# Patient Record
Sex: Female | Born: 1970 | Race: White | Hispanic: No | Marital: Married | Smoking: Former smoker
Health system: Southern US, Community
[De-identification: ages and names within clinical notes are randomized; demographics above are authoritative.]

## PROBLEM LIST (undated history)

## (undated) DIAGNOSIS — O24419 Gestational diabetes mellitus in pregnancy, unspecified control: Secondary | ICD-10-CM

## (undated) DIAGNOSIS — F410 Panic disorder [episodic paroxysmal anxiety] without agoraphobia: Secondary | ICD-10-CM

## (undated) DIAGNOSIS — O139 Gestational [pregnancy-induced] hypertension without significant proteinuria, unspecified trimester: Secondary | ICD-10-CM

## (undated) HISTORY — PX: REDUCTION MAMMAPLASTY: SUR839

## (undated) HISTORY — PX: BREAST REDUCTION SURGERY: SHX8

## (undated) HISTORY — PX: APPENDECTOMY: SHX54

## (undated) HISTORY — PX: ABDOMINOPLASTY: SUR9

---

## 2000-11-06 ENCOUNTER — Other Ambulatory Visit: Admission: RE | Admit: 2000-11-06 | Discharge: 2000-11-06 | Payer: Self-pay | Admitting: Obstetrics & Gynecology

## 2000-12-27 ENCOUNTER — Other Ambulatory Visit: Admission: RE | Admit: 2000-12-27 | Discharge: 2000-12-27 | Payer: Self-pay | Admitting: Obstetrics and Gynecology

## 2004-04-15 ENCOUNTER — Emergency Department (HOSPITAL_COMMUNITY): Admission: EM | Admit: 2004-04-15 | Discharge: 2004-04-15 | Payer: Self-pay | Admitting: Emergency Medicine

## 2009-09-16 ENCOUNTER — Encounter: Admission: RE | Admit: 2009-09-16 | Discharge: 2009-09-16 | Payer: Self-pay | Admitting: Obstetrics and Gynecology

## 2017-09-16 ENCOUNTER — Ambulatory Visit (INDEPENDENT_AMBULATORY_CARE_PROVIDER_SITE_OTHER): Payer: BLUE CROSS/BLUE SHIELD | Admitting: Psychology

## 2017-09-16 DIAGNOSIS — F4323 Adjustment disorder with mixed anxiety and depressed mood: Secondary | ICD-10-CM

## 2017-09-17 NOTE — Progress Notes (Deleted)
[facility] [address] [place], [place] [address] [phone]  Date:  09/18/2017   Name:  Maria Bell   DOB:  [birth year]   MRN:  [id number]  PCP:  Patient, No Pcp Per    Chief Complaint: No chief complaint on file.   History of Present Illness:  Maria Bell is a 46 y.o. very pleasant female patient who presents with the following:  Here today as a new patient to establish care Reviewed a note from the ER at [facility] from last summer:  Maria Bell is a 46 y.o. female presents to the ED via EMS due to a panic attack, onset PTA. Patient states that she is going through a difficult divorce and she had a bad argument with her husband who she states is a big time music executive. Patient states that she shares a 2 year old son with this husband and she states that stress of everything is causing her to begin having panic attacks. Patient has no previous hx of panic attacks until recently and states that when these panic attacks come on, she feels like she cannot breathe, move, or even remember her name, but once they subside she is ok. She also states that she gets numbness and tingling in her extremities and a feeling of close syncope when the attacks come on. Patient states her first panic attack happened at her OBGYN who treated her for it. Patient lives in [place] and her husband lives in [place] and she states that her husband had to come here from [place] to get her son for this panic attack episode and now her attorneys are worried about her obtaining and keeping custody of her son which is adding to her anxiety. She states she also had a panic attack on the stand in court in [place] two months ago where BP was 180/120. Patient currently has a headache and is requesting pain medication. Patient denies SI, HI, or audiovisual hallucinations.  Past Medical History Past Medical History  Diagnosis Date  . ADD (attention  deficit disorder)  . Panic attack   Past Surgical History Past Surgical History  Procedure Laterality Date  . Appendectomy  . Abdominoplasty  . Reduction mammaplasty  . Cesarean section   Per notes, she requested narcotic pain medications and also vyvanse at that time, was told no, and left AMA.   Reviewed NCCSR 10/16: he has been getting an unusual combination of adderall 20 and 10 from a couple of differnet providers recently     There are no active problems to display for this patient.   No past medical history on file.  No past surgical history on file.  Social History  Substance Use Topics  . Smoking status: Not on file  . Smokeless tobacco: Not on file  . Alcohol use Not on file    No family history on file.  Allergies not on file  Medication list has been reviewed and updated.  No current outpatient prescriptions on file prior to visit.   No current facility-administered medications on file prior to visit.     Review of Systems:  As per HPI- otherwise negative.   Physical Examination: There were no vitals filed for this visit. There were no vitals filed for this visit. There is no height or weight on file to calculate BMI. Ideal Body Weight:    GEN: WDWN, NAD, Non-toxic, A & O x 3 HEENT:  Atraumatic, Normocephalic. Neck supple. No masses, No LAD. Ears and Nose: No external deformity. CV: RRR, No M/G/R. No JVD. No thrill. No extra heart sounds. PULM: CTA B, no wheezes, crackles, rhonchi. No retractions. No resp. distress. No accessory muscle use. ABD: S, NT, ND, +BS. No rebound. No HSM. EXTR: No c/c/e NEURO Normal gait.  PSYCH: Normally interactive. Conversant. Not depressed or anxious appearing.  Calm demeanor.    Assessment and Plan: ***  Signed Lamar Blinks, MD

## 2017-09-18 ENCOUNTER — Ambulatory Visit: Payer: Self-pay | Admitting: Family Medicine

## 2017-09-18 DIAGNOSIS — Z0289 Encounter for other administrative examinations: Secondary | ICD-10-CM

## 2017-09-30 ENCOUNTER — Ambulatory Visit: Payer: Self-pay | Admitting: Psychology

## 2017-10-01 ENCOUNTER — Ambulatory Visit (INDEPENDENT_AMBULATORY_CARE_PROVIDER_SITE_OTHER): Payer: BLUE CROSS/BLUE SHIELD | Admitting: Psychology

## 2017-10-01 DIAGNOSIS — F4323 Adjustment disorder with mixed anxiety and depressed mood: Secondary | ICD-10-CM

## 2017-10-21 ENCOUNTER — Ambulatory Visit: Payer: BLUE CROSS/BLUE SHIELD | Admitting: Psychology

## 2017-10-28 ENCOUNTER — Ambulatory Visit: Payer: Self-pay | Admitting: Psychology

## 2017-10-30 ENCOUNTER — Ambulatory Visit (INDEPENDENT_AMBULATORY_CARE_PROVIDER_SITE_OTHER): Payer: BLUE CROSS/BLUE SHIELD | Admitting: Psychology

## 2017-10-30 DIAGNOSIS — F4323 Adjustment disorder with mixed anxiety and depressed mood: Secondary | ICD-10-CM | POA: Diagnosis not present

## 2017-11-13 ENCOUNTER — Ambulatory Visit (INDEPENDENT_AMBULATORY_CARE_PROVIDER_SITE_OTHER): Payer: BLUE CROSS/BLUE SHIELD | Admitting: Psychology

## 2017-11-13 DIAGNOSIS — F4323 Adjustment disorder with mixed anxiety and depressed mood: Secondary | ICD-10-CM | POA: Diagnosis not present

## 2017-11-22 ENCOUNTER — Ambulatory Visit (INDEPENDENT_AMBULATORY_CARE_PROVIDER_SITE_OTHER): Payer: BLUE CROSS/BLUE SHIELD | Admitting: Psychology

## 2017-11-22 DIAGNOSIS — F4323 Adjustment disorder with mixed anxiety and depressed mood: Secondary | ICD-10-CM | POA: Diagnosis not present

## 2017-12-23 ENCOUNTER — Ambulatory Visit: Payer: Self-pay | Admitting: Psychology

## 2017-12-24 ENCOUNTER — Ambulatory Visit (INDEPENDENT_AMBULATORY_CARE_PROVIDER_SITE_OTHER): Payer: BLUE CROSS/BLUE SHIELD | Admitting: Psychology

## 2017-12-24 DIAGNOSIS — F4323 Adjustment disorder with mixed anxiety and depressed mood: Secondary | ICD-10-CM | POA: Diagnosis not present

## 2018-01-01 ENCOUNTER — Ambulatory Visit (INDEPENDENT_AMBULATORY_CARE_PROVIDER_SITE_OTHER): Payer: BLUE CROSS/BLUE SHIELD | Admitting: Psychology

## 2018-01-01 DIAGNOSIS — F4323 Adjustment disorder with mixed anxiety and depressed mood: Secondary | ICD-10-CM

## 2018-01-02 ENCOUNTER — Ambulatory Visit: Payer: BLUE CROSS/BLUE SHIELD | Admitting: Psychology

## 2018-01-13 ENCOUNTER — Ambulatory Visit: Payer: BLUE CROSS/BLUE SHIELD | Admitting: Psychology

## 2018-01-23 ENCOUNTER — Ambulatory Visit (INDEPENDENT_AMBULATORY_CARE_PROVIDER_SITE_OTHER): Payer: BLUE CROSS/BLUE SHIELD | Admitting: Psychology

## 2018-01-23 DIAGNOSIS — F4323 Adjustment disorder with mixed anxiety and depressed mood: Secondary | ICD-10-CM | POA: Diagnosis not present

## 2018-02-28 ENCOUNTER — Emergency Department (HOSPITAL_BASED_OUTPATIENT_CLINIC_OR_DEPARTMENT_OTHER): Payer: BLUE CROSS/BLUE SHIELD

## 2018-02-28 ENCOUNTER — Emergency Department (HOSPITAL_BASED_OUTPATIENT_CLINIC_OR_DEPARTMENT_OTHER)
Admission: EM | Admit: 2018-02-28 | Discharge: 2018-02-28 | Disposition: A | Discharge status: Home / Self Care | Payer: BLUE CROSS/BLUE SHIELD | Attending: Emergency Medicine | Admitting: Emergency Medicine

## 2018-02-28 ENCOUNTER — Encounter (HOSPITAL_BASED_OUTPATIENT_CLINIC_OR_DEPARTMENT_OTHER): Payer: Self-pay

## 2018-02-28 ENCOUNTER — Other Ambulatory Visit: Payer: Self-pay

## 2018-02-28 DIAGNOSIS — F17200 Nicotine dependence, unspecified, uncomplicated: Secondary | ICD-10-CM | POA: Diagnosis not present

## 2018-02-28 DIAGNOSIS — R42 Dizziness and giddiness: Secondary | ICD-10-CM

## 2018-02-28 DIAGNOSIS — R51 Headache: Secondary | ICD-10-CM | POA: Insufficient documentation

## 2018-02-28 DIAGNOSIS — R519 Headache, unspecified: Secondary | ICD-10-CM

## 2018-02-28 HISTORY — DX: Panic disorder (episodic paroxysmal anxiety): F41.0

## 2018-02-28 HISTORY — DX: Gestational diabetes mellitus in pregnancy, unspecified control: O24.419

## 2018-02-28 HISTORY — DX: Gestational (pregnancy-induced) hypertension without significant proteinuria, unspecified trimester: O13.9

## 2018-02-28 LAB — CBC
HCT: 45 % (ref 36.0–46.0)
Hemoglobin: 16.2 g/dL — ABNORMAL HIGH (ref 12.0–15.0)
MCH: 32.1 pg (ref 26.0–34.0)
MCHC: 36 g/dL (ref 30.0–36.0)
MCV: 89.3 fL (ref 78.0–100.0)
Platelets: 231 10*3/uL (ref 150–400)
RBC: 5.04 MIL/uL (ref 3.87–5.11)
RDW: 12.6 % (ref 11.5–15.5)
WBC: 7.5 10*3/uL (ref 4.0–10.5)

## 2018-02-28 LAB — URINALYSIS, ROUTINE W REFLEX MICROSCOPIC
Bilirubin Urine: NEGATIVE
Glucose, UA: NEGATIVE mg/dL
Hgb urine dipstick: NEGATIVE
Ketones, ur: NEGATIVE mg/dL
Leukocytes, UA: NEGATIVE
Nitrite: NEGATIVE
Protein, ur: NEGATIVE mg/dL
Specific Gravity, Urine: <1.005 — ABNORMAL LOW (ref 1.005–1.030)
pH: 6 (ref 5.0–8.0)

## 2018-02-28 LAB — TROPONIN I: Troponin I: <0.03 ng/mL (ref <0.03)

## 2018-02-28 LAB — BASIC METABOLIC PANEL
Anion gap: 11 (ref 5–15)
BUN: 10 mg/dL (ref 6–20)
CO2: 25 mmol/L (ref 22–32)
Calcium: 9.9 mg/dL (ref 8.9–10.3)
Chloride: 102 mmol/L (ref 101–111)
Creatinine, Ser: 0.96 mg/dL (ref 0.44–1.00)
GFR calc Af Amer: >60 mL/min (ref >60)
GFR calc non Af Amer: >60 mL/min (ref >60)
Glucose, Bld: 107 mg/dL — ABNORMAL HIGH (ref 65–99)
Potassium: 3.5 mmol/L (ref 3.5–5.1)
Sodium: 138 mmol/L (ref 135–145)

## 2018-02-28 LAB — CBG MONITORING, ED: Glucose-Capillary: 77 mg/dL (ref 65–99)

## 2018-02-28 LAB — D-DIMER, QUANTITATIVE: D-Dimer, Quant: <0.27 ug/mL-FEU (ref 0.00–0.50)

## 2018-02-28 LAB — PREGNANCY, URINE: Preg Test, Ur: NEGATIVE

## 2018-02-28 MED ORDER — SODIUM CHLORIDE 0.9 % IV BOLUS
1000.0000 mL | Freq: Once | INTRAVENOUS | Status: AC
  Administered 2018-02-28: 1000 mL via INTRAVENOUS

## 2018-02-28 MED ORDER — ACETAMINOPHEN 325 MG PO TABS
650.0000 mg | ORAL_TABLET | Freq: Once | ORAL | Status: AC
  Administered 2018-02-28: 650 mg via ORAL
  Filled 2018-02-28: qty 2

## 2018-02-28 MED ORDER — METOCLOPRAMIDE HCL 10 MG PO TABS
10.0000 mg | ORAL_TABLET | Freq: Once | ORAL | Status: AC
  Administered 2018-02-28: 10 mg via ORAL
  Filled 2018-02-28: qty 1

## 2018-02-28 MED ORDER — LORAZEPAM 0.5 MG PO TABS: 0.5000 mg | ORAL_TABLET | Freq: Three times a day (TID) | ORAL | 0 refills | Status: DC

## 2018-02-28 MED ORDER — KETOROLAC TROMETHAMINE 15 MG/ML IJ SOLN
15.0000 mg | Freq: Once | INTRAMUSCULAR | Status: AC
  Administered 2018-02-28: 15 mg via INTRAVENOUS
  Filled 2018-02-28: qty 1

## 2018-02-28 NOTE — ED Notes (Signed)
ED Provider at bedside. 

## 2018-02-28 NOTE — ED Triage Notes (Signed)
C/o dizziness, HA, right side facial ain, right UE and LE numbness x 5 days-reports hx of same type sx 1.5 years ago with no testing/dx-pt NAD-steady gait

## 2018-02-28 NOTE — Discharge Instructions (Addendum)
The CT scan of your head did not show any evidence of bleeding in your brain or prior stroke. Your chest x-ray was negative for pneumonia or collapsed lung.  Your lab work was negative for any elevation in your white blood cell count, anemia, or electrolyte abnormalities.  You had normal kidney function.  You will need to follow-up with your primary care doctor for further treatment of your symptoms.  You should return to the emergency department for any persistent headaches, fevers, chest pain, shortness of breath, or any new or worsening symptoms.

## 2018-02-28 NOTE — ED Notes (Signed)
Pt reports feeling fatigued, HA, and "gagging that is triggered by smells". Pt very tearful on assessment.

## 2018-02-28 NOTE — ED Notes (Signed)
Pt returned from radiology.

## 2018-02-28 NOTE — ED Provider Notes (Signed)
[facility] EMERGENCY DEPARTMENT Provider Note   CSN: [id number] Arrival date & time: 02/28/18  1522     History   Chief Complaint Chief Complaint  Patient presents with  . Dizziness    HPI Maria Bell is a 47 y.o. female.  HPI   Patient is a 47 year old female with history of ADHD and chronic headaches presents the ED today complaining of lightheadedness/dizziness that began 5 days ago.  Patient describes a feeling as "feels like I am going to pass out".  States she has had intermittent episodes but is not able to articulate how long episodes last.  States she had an episode lasted about an hour today.  Episodes not specifically associated with exertion and can happen anytime.  Notes associated blurry vision and intermittent weakness to the bilateral upper extremities when she has symptoms.  Denies that she has actually passed out.  Denies that the room is spinning when these episodes occur.  She also reports associated chest heaviness and shortness of breath.  States "feels like I just ran a marathon."  Denies actual pain to her chest. States she has "violent gag episodes" that are triggered by smells, which cause her to lose control of her bladder. Reports intermittent tingling to BLE and feet. No numbness to BUE or BLE. No persistent weakness to extremities.   Also reports intermittent headaches over the last 5 days as well.  States pain is 8/10.  Denies sudden onset.  Denies worse headache of life.  States she has a history of similar headaches that are normally relieved with Tylenol and Advil. States she has been taking a friend's blood pressure medication, losartan 12.5 mg for the last 5 days since she started having symptoms.  Symptoms began before taking the medication.  Pt had similar sxs 1.5 years ago. States she was told she had a panic attack at that time but she does not believe that sxs were related to panic attack.   Denies recent fevers, cough, cold, congestion,  sore throat.  Denies abdominal pain, vomiting, diarrhea, constipation.  Denies urinary or vaginal symptoms.  Denies leg pain/swelling, hemoptysis, recent surgery/trauma, recent long travel, hormone use, personal hx of cancer, or hx of DVT/PE.  Patient says she went through menopause at 57 and there is no chance of pregnancy.  Past Medical History:  Diagnosis Date  . Panic attack   . Pregnancy induced hypertension   . Pregnancy-induced diabetes     There are no active problems to display for this patient.   Past Surgical History:  Procedure Laterality Date  . ABDOMINOPLASTY    . APPENDECTOMY    . BREAST REDUCTION SURGERY    . CESAREAN SECTION       OB History   None      Home Medications    Prior to Admission medications   Medication Sig Start Date End Date Taking? Authorizing Provider  LORazepam (ATIVAN) 0.5 MG tablet Take 1 tablet (0.5 mg total) by mouth every 8 (eight) hours. Do not work, drive or operate machinery while taking this medication 02/28/18   Chrystina Naff S, PA-C    Family History No family history on file.  Social History Social History   Tobacco Use  . Smoking status: Current Every Day Smoker  . Smokeless tobacco: Never Used  Substance Use Topics  . Alcohol use: Yes    Comment: daily  . Drug use: Never     Allergies   Patient has no known allergies.  Review of Systems Review of Systems  Constitutional: Negative for chills and fever.  HENT: Negative for congestion, ear pain, rhinorrhea and sore throat.   Eyes: Positive for visual disturbance (resolved). Negative for photophobia.  Respiratory: Positive for shortness of breath. Negative for cough and wheezing.   Cardiovascular: Negative for chest pain and leg swelling.       Chest heaviness  Gastrointestinal: Positive for nausea. Negative for abdominal pain, blood in stool, constipation, diarrhea and vomiting.  Genitourinary: Negative for dysuria, flank pain, frequency, hematuria and  urgency.  Musculoskeletal: Negative for back pain and neck pain.  Skin: Negative for color change and rash.  Neurological: Positive for dizziness, weakness, light-headedness and headaches. Negative for seizures, syncope and numbness.  All other systems reviewed and are negative.    Physical Exam Updated Vital Signs BP 112/84   Pulse 84   Temp 98.5 F (36.9 C) (Oral)   Resp (!) 23   Ht 5\' 4"  (1.626 m)   Wt 74.7 kg (164 lb 10.9 oz)   SpO2 100%   BMI 28.27 kg/m   Physical Exam  Constitutional: She appears well-developed and well-nourished. No distress.  HENT:  Head: Normocephalic and atraumatic.  Right Ear: External ear normal.  Left Ear: External ear normal.  Nose: Nose normal.  Mouth/Throat: Oropharynx is clear and moist. No oropharyngeal exudate.  Eyes: Pupils are equal, round, and reactive to light. Conjunctivae and EOM are normal.  No nystagmus  Neck: Normal range of motion. Neck supple.  Cardiovascular: Normal rate, regular rhythm, normal heart sounds and intact distal pulses.  No murmur heard. Pulmonary/Chest: Effort normal and breath sounds normal. No respiratory distress. She has no wheezes.  Abdominal: Soft. Bowel sounds are normal. She exhibits no distension. There is no tenderness. There is no guarding.  Musculoskeletal: She exhibits no edema.  No calf TTP, erythema, swelling.  Neurological: She is alert.  Mental Status:  Alert, thought content appropriate, able to give a coherent history. Speech fluent without evidence of aphasia. Able to follow 2 step commands without difficulty.  Cranial Nerves:  II:  Peripheral visual fields grossly normal, pupils equal, round, reactive to light III,IV, VI: ptosis not present, extra-ocular motions intact bilaterally  V,VII: smile symmetric, facial light touch sensation equal VIII: hearing grossly normal to voice  X: uvula elevates symmetrically  XI: bilateral shoulder shrug symmetric and strong XII: midline tongue  extension without fassiculations Motor:  Normal tone. 5/5 strength of BUE and BLE major muscle groups including strong and equal grip strength and dorsiflexion/plantar flexion Sensory: light touch normal in all extremities. DTRs: biceps and achilles 2+ symmetric b/l Cerebellar: normal finger-to-nose with bilateral upper extremities Gait: normal gait and balance. Able to walk on toes and heels with ease.  CV: 2+ radial and DP/PT pulses Negative romberg, no pronator drift  Skin: Skin is warm and dry.  Psychiatric: She has a normal mood and affect.  Nursing note and vitals reviewed.    ED Treatments / Results  Labs (all labs ordered are listed, but only abnormal results are displayed) Labs Reviewed  BASIC METABOLIC PANEL - Abnormal; Notable for the following components:      Result Value   Glucose, Bld 107 (*)    All other components within normal limits  CBC - Abnormal; Notable for the following components:   Hemoglobin 16.2 (*)    All other components within normal limits  URINALYSIS, ROUTINE W REFLEX MICROSCOPIC - Abnormal; Notable for the following components:   Specific Gravity, Urine <  1.005 (*)    All other components within normal limits  PREGNANCY, URINE  D-DIMER, QUANTITATIVE (NOT AT [facility])  TROPONIN I  CBG MONITORING, ED    EKG EKG Interpretation  Date/Time:  Friday February 28 2018 15:40:16 EDT Ventricular Rate:  120 PR Interval:  122 QRS Duration: 80 QT Interval:  326 QTC Calculation: 460 R Axis:   68 Text Interpretation:  Sinus tachycardia Right atrial enlargement Borderline ECG no prior to compare with  Confirmed by Meridee ScoreButler, Michael [phone]([id number]) on 02/28/2018 4:29:26 PM   Radiology Dg Chest 2 View  Result Date: 02/28/2018 CLINICAL DATA:  Gagging for 1 week.  Headaches. EXAM: CHEST - 2 VIEW COMPARISON:  None. FINDINGS: The heart size and mediastinal contours are within normal limits. Both lungs are clear. The visualized skeletal structures are unremarkable.  IMPRESSION: No active cardiopulmonary disease. Electronically Signed   By: Elige KoHetal  Patel   On: 02/28/2018 19:56   Ct Head Wo Contrast  Result Date: 02/28/2018 CLINICAL DATA:  Headache and right arm weakness EXAM: CT HEAD WITHOUT CONTRAST TECHNIQUE: Contiguous axial images were obtained from the base of the skull through the vertex without intravenous contrast. COMPARISON:  Head CT 02/08/2015 FINDINGS: Brain: No mass lesion, intraparenchymal hemorrhage or extra-axial collection. No evidence of acute cortical infarct. Normal appearance of the brain parenchyma and extra axial spaces for age. Vascular: No hyperdense vessel or unexpected vascular calcification. Skull: Normal visualized skull base, calvarium and extracranial soft tissues. Sinuses/Orbits: No sinus fluid levels or advanced mucosal thickening. No mastoid effusion. Normal orbits. IMPRESSION: Normal head CT. Electronically Signed   By: Deatra RobinsonKevin  Herman M.D.   On: 02/28/2018 19:44    Procedures Procedures (including critical care time)  Medications Ordered in ED Medications  sodium chloride 0.9 % bolus 1,000 mL (0 mLs Intravenous Stopped 02/28/18 2044)  acetaminophen (TYLENOL) tablet 650 mg (650 mg Oral Given 02/28/18 1912)  ketorolac (TORADOL) 15 MG/ML injection 15 mg (15 mg Intravenous Given 02/28/18 2013)  metoCLOPramide (REGLAN) tablet 10 mg (10 mg Oral Given 02/28/18 2013)     Initial Impression / Assessment and Plan / ED Course  I have reviewed the triage vital signs and the nursing notes.  Pertinent labs & imaging results that were available during my care of the patient were reviewed by me and considered in my medical decision making (see chart for details).  Clinical Course as of Mar 01 2119  Fri Feb 28, 2018  2033 Well-appearing 47 year old female complaining of worsening headache with associated dizziness and elevated blood pressure over the course of a few days.  Her workup including head CT and d-dimer been negative.  She has an  appointment with her PCP and I think it is reasonable that we give her some symptomatic relief until then.   [MB]    Clinical Course User Index [MB] Terrilee FilesButler, Michael C, MD   Pt requesting ativan during Dr. Silvana NewnessButler's exam. Will give short course of ativan to go home with. He personally evaluated the pt and agrees with plan for discharge.  Final Clinical Impressions(s) / ED Diagnoses   Final diagnoses:  Nonintractable headache, unspecified chronicity pattern, unspecified headache type  Lightheaded   Patient is to be discharged with recommendation to follow up with PCP in regards to today's hospital visit. Lightheadedness/near syncope/headahce sxs of unclear etiology. No focal deficits on neuro exam. Head ct negative. Pt convinced that headaches are due to elevated blood pressure despite BP reading of 112/84. Do not suspect acute/emergency intracranial abnormality that could be  causing sxs. She has a h/o headaches and was tx with toradal and reglan here. sxs are unlikely due to cardiac etiology as cardiac workup negative. sxs not likely of pulmonary etiology d/t presentation, PERC negative, VSS, no tracheal deviation, no JVD or new murmur, RRR, breath sounds equal bilaterally, EKG without acute abnormalities, negative troponin, and negative CXR. ddimer negative. Ecg with sinus tach to 120, however no arrhythmia noted. No st elevations or depressions. Pt had HR between 80-90 throughout my entire exam and during all rechecks. She has been advised to return to the ED if CP becomes exertional, associated with diaphoresis or nausea, radiates to left jaw/arm, worsens or becomes concerning in any way. Advised her to stop taking her friends bp medication and to have her BP rechecked in 48 hours. Advised that if elevated she can return to ED, urgent care, or pcp for further eval. Pt appears reliable for follow up and is agreeable to discharge.    ED Discharge Orders        Ordered    LORazepam (ATIVAN) 0.5 MG  tablet  Every 8 hours     02/28/18 2042       Rayne Du 02/28/18 2120    Terrilee Files, MD 03/01/18 [phone]

## 2018-03-07 ENCOUNTER — Ambulatory Visit (INDEPENDENT_AMBULATORY_CARE_PROVIDER_SITE_OTHER): Payer: BLUE CROSS/BLUE SHIELD | Admitting: Family Medicine

## 2018-03-07 ENCOUNTER — Encounter: Payer: Self-pay | Admitting: Family Medicine

## 2018-03-07 VITALS — BP 120/88 | HR 80 | Temp 98.6°F | Ht 64.0 in | Wt 166.2 lb

## 2018-03-07 DIAGNOSIS — Z0000 Encounter for general adult medical examination without abnormal findings: Secondary | ICD-10-CM | POA: Diagnosis not present

## 2018-03-07 DIAGNOSIS — Z720 Tobacco use: Secondary | ICD-10-CM | POA: Diagnosis not present

## 2018-03-07 DIAGNOSIS — Z23 Encounter for immunization: Secondary | ICD-10-CM | POA: Diagnosis not present

## 2018-03-07 DIAGNOSIS — Z1283 Encounter for screening for malignant neoplasm of skin: Secondary | ICD-10-CM | POA: Diagnosis not present

## 2018-03-07 LAB — LIPID PANEL
Cholesterol: 224 mg/dL — ABNORMAL HIGH (ref 0–200)
HDL: 60.7 mg/dL (ref >39.00)
LDL Cholesterol: 144 mg/dL — ABNORMAL HIGH (ref 0–99)
NonHDL: 163.7
Total CHOL/HDL Ratio: 4
Triglycerides: 98 mg/dL (ref 0.0–149.0)
VLDL: 19.6 mg/dL (ref 0.0–40.0)

## 2018-03-07 LAB — COMPREHENSIVE METABOLIC PANEL
ALT: 31 U/L (ref 0–35)
AST: 20 U/L (ref 0–37)
Albumin: 4.4 g/dL (ref 3.5–5.2)
Alkaline Phosphatase: 79 U/L (ref 39–117)
BUN: 10 mg/dL (ref 6–23)
CO2: 30 mEq/L (ref 19–32)
Calcium: 9.8 mg/dL (ref 8.4–10.5)
Chloride: 103 mEq/L (ref 96–112)
Creatinine, Ser: 0.79 mg/dL (ref 0.40–1.20)
GFR: 83.8 mL/min (ref >60.00)
Glucose, Bld: 101 mg/dL — ABNORMAL HIGH (ref 70–99)
Potassium: 4.9 mEq/L (ref 3.5–5.1)
Sodium: 141 mEq/L (ref 135–145)
Total Bilirubin: 0.6 mg/dL (ref 0.2–1.2)
Total Protein: 6.9 g/dL (ref 6.0–8.3)

## 2018-03-07 LAB — CBC
HCT: 41.2 % (ref 36.0–46.0)
Hemoglobin: 14.5 g/dL (ref 12.0–15.0)
MCHC: 35.1 g/dL (ref 30.0–36.0)
MCV: 91.5 fl (ref 78.0–100.0)
Platelets: 196 10*3/uL (ref 150.0–400.0)
RBC: 4.51 Mil/uL (ref 3.87–5.11)
RDW: 13.1 % (ref 11.5–15.5)
WBC: 5.9 10*3/uL (ref 4.0–10.5)

## 2018-03-07 NOTE — Progress Notes (Signed)
Pre visit review using our clinic review tool, if applicable. No additional management support is needed unless otherwise documented below in the visit note. 

## 2018-03-07 NOTE — Progress Notes (Signed)
Chief Complaint  Patient presents with  . Establish Care     Well [facility] is here for a complete physical.   Her last physical was >1 year ago.  Current diet: in general, a "healthy" diet. Current exercise: none.  Weight is stable and she denies daytime fatigue. No LMP recorded. Patient is postmenopausal. Seatbelt? Yes  Health Maintenance Pap/HPV- Yes Mammogram- Yes Tetanus- No HIV screening- Yes - 03/20/17  Past Medical History:  Diagnosis Date  . Panic attack   . Pregnancy induced hypertension   . Pregnancy-induced diabetes      Past Surgical History:  Procedure Laterality Date  . ABDOMINOPLASTY    . APPENDECTOMY    . BREAST REDUCTION SURGERY    . CESAREAN SECTION     Medications  Current Outpatient Medications on File Prior to Visit  Medication Sig Dispense Refill  . amphetamine-dextroamphetamine (ADDERALL) 20 MG tablet Take 20 mg by mouth daily.     Allergies No Known Allergies  Review of Systems: Constitutional:  no fevers  Eye:  +poor vision Ear/Nose/Mouth/Throat:  Ears:  no hearing loss Nose/Mouth/Throat:  no complaints of nasal congestion, no sore throat Cardiovascular: no chest pain, no palpitations Respiratory:  no cough and no shortness of breath Gastrointestinal:  no abdominal pain, no change in bowel habits, no significant change in appetite, no nausea, vomiting, diarrhea, or constipation and no black or bloody stool GU:  Female: negative for dysuria, frequency, and incontinence; no abnormal bleeding, pelvic pain, or discharge Musculoskeletal/Extremities:  no pain of the joints Integumentary (Skin/Breast):  No specific abnormal skin lesions reported, has sun spots she would like to see a dermatologist for Neurologic:  no headaches Hematologic/Lymphatic:  no unexpected weight changes  Exam BP 120/88 (BP Location: Right Arm, Patient Position: Sitting, Cuff Size: Normal)   Pulse 80   Temp 98.6 F (37 C) (Oral)   Ht 5\' 4"  (1.626 m)   Wt  166 lb 4 oz (75.4 kg)   SpO2 98%   BMI 28.54 kg/m  General:  well developed, well nourished, in no apparent distress Skin: various solar lentigo noted on exposed skin; otherwise no significant moles, warts, or growths Head:  no masses, lesions, or tenderness Eyes:  pupils equal and round, sclera anicteric without injection Ears:  canals without lesions, TMs shiny without retraction, no obvious effusion, no erythema Nose:  nares patent, septum midline, mucosa normal, and no drainage or sinus tenderness Throat/Pharynx:  lips and gingiva without lesion; tongue and uvula midline; non-inflamed pharynx; no exudates or postnasal drainage Neck: neck supple without adenopathy, thyromegaly, or masses Breasts:  Not done Thorax:  nontender Lungs:  clear to auscultation, breath sounds equal bilaterally, no respiratory distress Cardio:  regular rate and rhythm without murmurs, heart sounds without clicks or rubs, point of maximal impulse normal; no lifts, heaves, or thrills Abdomen:  abdomen soft, nontender; bowel sounds normal; no masses or organomegaly Genital: Defer to GYN Musculoskeletal:  symmetrical muscle groups noted without atrophy or deformity Extremities:  no clubbing, cyanosis, or edema, no deformities, no skin discoloration Neuro:  gait normal; deep tendon reflexes normal and symmetric Psych: well oriented with normal range of affect and appropriate judgment/insight  Assessment and Plan  Well adult exam - Plan: CBC, Comprehensive metabolic panel, Lipid panel  Need for tetanus booster - Plan: Tdap vaccine greater than or equal to 7yo IM  Need for vaccination against Streptococcus pneumoniae - Plan: Pneumococcal polysaccharide vaccine 23-valent greater than or equal to 2yo subcutaneous/IM  Skin exam for malignant neoplasm - Plan: Ambulatory referral to Dermatology  Tobacco abuse   Well 47 y.o. female. Counseled on diet and exercise. She plans to get back down to 120 lbs by cleaning  up diet and starting to exercise again.  Other orders as above. Stop smoking.  She follows with GYN for women's health.  Follow up in 1 yr or prn. The patient voiced understanding and agreement to the plan.  Jilda Roche [place], DO 03/07/18 12:06 PM

## 2018-03-07 NOTE — Patient Instructions (Signed)
Aim to do some physical exertion for 150 minutes per week. This is typically divided into 5 days per week, 30 minutes per day. The activity should be enough to get your heart rate up. Anything is better than nothing if you have time constraints.  Keep the diet clean.  Stop smoking!  Let us know if you need anything.

## 2018-03-19 ENCOUNTER — Encounter: Payer: BLUE CROSS/BLUE SHIELD | Admitting: Family Medicine

## 2018-03-20 ENCOUNTER — Other Ambulatory Visit: Payer: Self-pay | Admitting: Obstetrics and Gynecology

## 2018-03-20 DIAGNOSIS — R928 Other abnormal and inconclusive findings on diagnostic imaging of breast: Secondary | ICD-10-CM

## 2018-03-25 ENCOUNTER — Ambulatory Visit
Admission: RE | Admit: 2018-03-25 | Discharge: 2018-03-25 | Disposition: A | Discharge status: Home / Self Care | Payer: BLUE CROSS/BLUE SHIELD | Source: Ambulatory Visit | Attending: Obstetrics and Gynecology | Admitting: Obstetrics and Gynecology

## 2018-03-25 ENCOUNTER — Other Ambulatory Visit: Payer: Self-pay | Admitting: Obstetrics and Gynecology

## 2018-03-25 DIAGNOSIS — R928 Other abnormal and inconclusive findings on diagnostic imaging of breast: Secondary | ICD-10-CM

## 2018-03-25 DIAGNOSIS — N6310 Unspecified lump in the right breast, unspecified quadrant: Secondary | ICD-10-CM

## 2018-04-29 ENCOUNTER — Emergency Department (HOSPITAL_BASED_OUTPATIENT_CLINIC_OR_DEPARTMENT_OTHER): Payer: BLUE CROSS/BLUE SHIELD

## 2018-04-29 ENCOUNTER — Emergency Department (HOSPITAL_BASED_OUTPATIENT_CLINIC_OR_DEPARTMENT_OTHER)
Admission: EM | Admit: 2018-04-29 | Discharge: 2018-04-29 | Disposition: A | Discharge status: Home / Self Care | Payer: BLUE CROSS/BLUE SHIELD | Attending: Emergency Medicine | Admitting: Emergency Medicine

## 2018-04-29 ENCOUNTER — Encounter (HOSPITAL_BASED_OUTPATIENT_CLINIC_OR_DEPARTMENT_OTHER): Payer: Self-pay

## 2018-04-29 ENCOUNTER — Other Ambulatory Visit: Payer: Self-pay

## 2018-04-29 DIAGNOSIS — S161XXA Strain of muscle, fascia and tendon at neck level, initial encounter: Secondary | ICD-10-CM | POA: Insufficient documentation

## 2018-04-29 DIAGNOSIS — Y999 Unspecified external cause status: Secondary | ICD-10-CM | POA: Diagnosis not present

## 2018-04-29 DIAGNOSIS — Y929 Unspecified place or not applicable: Secondary | ICD-10-CM | POA: Diagnosis not present

## 2018-04-29 DIAGNOSIS — Y9389 Activity, other specified: Secondary | ICD-10-CM | POA: Insufficient documentation

## 2018-04-29 DIAGNOSIS — F17210 Nicotine dependence, cigarettes, uncomplicated: Secondary | ICD-10-CM | POA: Insufficient documentation

## 2018-04-29 DIAGNOSIS — S0990XA Unspecified injury of head, initial encounter: Secondary | ICD-10-CM | POA: Diagnosis present

## 2018-04-29 DIAGNOSIS — W010XXA Fall on same level from slipping, tripping and stumbling without subsequent striking against object, initial encounter: Secondary | ICD-10-CM | POA: Insufficient documentation

## 2018-04-29 DIAGNOSIS — S060X0A Concussion without loss of consciousness, initial encounter: Secondary | ICD-10-CM | POA: Insufficient documentation

## 2018-04-29 MED ORDER — HYDROCODONE-ACETAMINOPHEN 5-325 MG PO TABS: 1.0000 | ORAL_TABLET | ORAL | 0 refills | Status: DC | PRN

## 2018-04-29 NOTE — ED Provider Notes (Signed)
[facility] EMERGENCY DEPARTMENT Provider Note   CSN: [id number] Arrival date & time: 04/29/18  1109     History   Chief Complaint Chief Complaint  Patient presents with  . Fall    HPI Maria Bell is a 47 y.o. female.  Patient is a 47 year old female who presents with headache after a fall.  She states that 3 days ago in the evening she decided to a hand stand and was leaning against a door that was not all the way closed.  She ended up falling through the door and landing on her head and neck.  For the last 2 days she has had nausea and vomiting with worsening headaches.  Today she felt a little bit better though still having headaches and dizziness.  She complains of a headache which is fairly diffuse.  She is still nauseated and has some dizziness but no vomiting today.  She has pain in her neck but no pain in her lower back.  No radiation down her arms.  No numbness or weakness to her extremities.  She denies any other injuries from the fall.  She is not on anticoagulants.     Past Medical History:  Diagnosis Date  . Panic attack   . Pregnancy induced hypertension   . Pregnancy-induced diabetes     Patient Active Problem List   Diagnosis Date Noted  . Tobacco abuse 03/07/2018    Past Surgical History:  Procedure Laterality Date  . ABDOMINOPLASTY    . APPENDECTOMY    . BREAST REDUCTION SURGERY    . CESAREAN SECTION       OB History   None      Home Medications    Prior to Admission medications   Medication Sig Start Date End Date Taking? Authorizing Provider  amphetamine-dextroamphetamine (ADDERALL) 20 MG tablet Take 20 mg by mouth daily.    [provider]    Family History Family History  Problem Relation Age of Onset  . Heart disease Father   . Breast cancer Maternal Grandmother     Social History Social History   Tobacco Use  . Smoking status: Current Every Day Smoker    Types: Cigarettes  . Smokeless tobacco: Never Used   Substance Use Topics  . Alcohol use: Yes    Comment: daily  . Drug use: Never     Allergies   Patient has no known allergies.   Review of Systems Review of Systems  Constitutional: Negative for activity change, appetite change and fever.  HENT: Negative for dental problem, nosebleeds and trouble swallowing.   Eyes: Negative for pain and visual disturbance.  Respiratory: Negative for shortness of breath.   Cardiovascular: Negative for chest pain.  Gastrointestinal: Positive for nausea and vomiting. Negative for abdominal pain.  Genitourinary: Negative for dysuria and hematuria.  Musculoskeletal: Positive for neck pain. Negative for arthralgias, back pain and joint swelling.  Skin: Negative for wound.  Neurological: Positive for dizziness, light-headedness and headaches. Negative for weakness and numbness.  Psychiatric/Behavioral: Negative for confusion.     Physical Exam Updated Vital Signs BP (!) 151/109 (BP Location: Left Arm)   Pulse 71   Temp 98.6 F (37 C) (Oral)   Resp 16   SpO2 100%   Physical Exam  Constitutional: She is oriented to person, place, and time. She appears well-developed and well-nourished.  HENT:  Head: Normocephalic and atraumatic.  Nose: Nose normal.  No hemotympanum  Eyes: Pupils are equal, round, and reactive to  light. Conjunctivae are normal.  Neck:  Positive tenderness throughout the cervical spine.  No pain to the thoracic or lumbosacral spine.  No step-offs or deformities noted  Cardiovascular: Normal rate and regular rhythm.  No murmur heard. No evidence of external trauma to the chest or abdomen  Pulmonary/Chest: Effort normal and breath sounds normal. No respiratory distress. She has no wheezes. She exhibits no tenderness.  Abdominal: Soft. Bowel sounds are normal. She exhibits no distension. There is no tenderness.  Musculoskeletal: Normal range of motion.  No pain on palpation or ROM of the extremities  Neurological: She is alert  and oriented to person, place, and time.  Motor 5 out of 5 all extremities, sensation grossly intact light touch all extremities  Skin: Skin is warm and dry. Capillary refill takes less than 2 seconds.  Psychiatric: She has a normal mood and affect.  Vitals reviewed.    ED Treatments / Results  Labs (all labs ordered are listed, but only abnormal results are displayed) Labs Reviewed - No data to display  EKG None  Radiology Ct Head Wo Contrast  Result Date: 04/29/2018 CLINICAL DATA:  Fall while doing a handstand several days ago with neck pain and headaches EXAM: CT HEAD WITHOUT CONTRAST CT CERVICAL SPINE WITHOUT CONTRAST TECHNIQUE: Multidetector CT imaging of the head and cervical spine was performed following the standard protocol without intravenous contrast. Multiplanar CT image reconstructions of the cervical spine were also generated. COMPARISON:  02/28/2018 CT of the head FINDINGS: CT HEAD FINDINGS Brain: No evidence of acute infarction, hemorrhage, hydrocephalus, extra-axial collection or mass lesion/mass effect. Vascular: No hyperdense vessel or unexpected calcification. Skull: Normal. Negative for fracture or focal lesion. Sinuses/Orbits: No acute finding. Other: None. CT CERVICAL SPINE FINDINGS Alignment: Within normal limits. Skull base and vertebrae: 7 cervical segments are well visualized. Mild osteophytic changes are noted at C6-7. No acute fracture or acute facet abnormality is noted. The odontoid is within normal limits. Soft tissues and spinal canal: No prevertebral fluid or swelling. No visible canal hematoma. Upper chest: Within normal limits. Other: None IMPRESSION: CT of the head: Normal head CT. CT of the cervical spine: Mild degenerative change at C6-7 without acute abnormality. Electronically Signed   By: Alcide Clever M.D.   On: 04/29/2018 12:36   Ct Cervical Spine Wo Contrast  Result Date: 04/29/2018 CLINICAL DATA:  Fall while doing a handstand several days ago with  neck pain and headaches EXAM: CT HEAD WITHOUT CONTRAST CT CERVICAL SPINE WITHOUT CONTRAST TECHNIQUE: Multidetector CT imaging of the head and cervical spine was performed following the standard protocol without intravenous contrast. Multiplanar CT image reconstructions of the cervical spine were also generated. COMPARISON:  02/28/2018 CT of the head FINDINGS: CT HEAD FINDINGS Brain: No evidence of acute infarction, hemorrhage, hydrocephalus, extra-axial collection or mass lesion/mass effect. Vascular: No hyperdense vessel or unexpected calcification. Skull: Normal. Negative for fracture or focal lesion. Sinuses/Orbits: No acute finding. Other: None. CT CERVICAL SPINE FINDINGS Alignment: Within normal limits. Skull base and vertebrae: 7 cervical segments are well visualized. Mild osteophytic changes are noted at C6-7. No acute fracture or acute facet abnormality is noted. The odontoid is within normal limits. Soft tissues and spinal canal: No prevertebral fluid or swelling. No visible canal hematoma. Upper chest: Within normal limits. Other: None IMPRESSION: CT of the head: Normal head CT. CT of the cervical spine: Mild degenerative change at C6-7 without acute abnormality. Electronically Signed   By: Alcide Clever M.D.   On: 04/29/2018  12:36    Procedures Procedures (including critical care time)  Medications Ordered in ED Medications - No data to display   Initial Impression / Assessment and Plan / ED Course  I have reviewed the triage vital signs and the nursing notes.  Pertinent labs & imaging results that were available during my care of the patient were reviewed by me and considered in my medical decision making (see chart for details).     Patient is a 47 year old female who presents after head injury.  She had no initial loss of consciousness but had ongoing headache and nausea vomiting.  CT scan shows no evidence of intracranial hemorrhage.  There is no evidence of bony injury to the cervical  spine.  She is neurologically intact.  She has no ongoing vomiting.  She was discharged home in good condition.  She was given concussion instructions.  She was encouraged to follow-up with her PCP.  Return precautions were given.  Final Clinical Impressions(s) / ED Diagnoses   Final diagnoses:  Concussion without loss of consciousness, initial encounter  Strain of neck muscle, initial encounter    ED Discharge Orders    None       Rolan Bucco, MD 04/29/18 1243

## 2018-04-29 NOTE — ED Notes (Signed)
ED Provider at bedside discussing test results and dispo plan of care.  Giving detailed instructions for post concussion care.

## 2018-04-29 NOTE — ED Triage Notes (Signed)
Pt states she fell doing a handstand 5/25-c/o HA, vomiting the day after-c/o cont'd sx-NAD-steady gait

## 2018-04-29 NOTE — ED Notes (Signed)
Patient transported to CT 

## 2018-08-06 ENCOUNTER — Ambulatory Visit: Payer: Self-pay | Admitting: *Deleted

## 2018-08-06 NOTE — Telephone Encounter (Signed)
Pt reports increasing anxiety and depression x 2 weeks, since recent  "emergency child custody orders" and ongoing divorce proceedings. States is also "fearful," has received death threats. Questioned if Patent examiner involved, states attorneys are "handling things". Denies any suicidal or homicidal ideations. States was seeing a Veterinary surgeon but not recently. Has been missing work, not sleeping. States BP trending up. Last one checked 149/96, occasional headache. Not eating well.  Pt is not on any antidepressant or anti-anxiety meds. Calm demeanor during NT. Spoke with office staff; OK to schedule with another provider as Dr. Carmelia Roller is unavailable this week. Appt made for tomorrow with Dr. Drue Novel. Behavioral Health number given to patient as a resource.  Reason for Disposition . Symptoms interfere with work or school  Answer Assessment - Initial Assessment Questions 1. CONCERN: "What happened that made you call today?"     Anxious, depressed,fearful, insomnia. 2. ANXIETY SYMPTOM SCREENING: "Can you describe how you have been feeling?"  (e.g., tense, restless, panicky, anxious, keyed up, trouble sleeping, trouble concentrating)    Trouble sleeping, concentrating, fearful, anxious 3. ONSET: "How long have you been feeling this way?"     07/25/18 4. RECURRENT: "Have you felt this way before?"  If yes: "What happened that time?" "What helped these feelings go away in the past?"      H/O anxiety 5. RISK OF HARM - SUICIDAL IDEATION:  "Do you ever have thoughts of hurting or killing yourself?"  (e.g., yes, no, no but preoccupation with thoughts about death)   - INTENT:  "Do you have thoughts of hurting or killing yourself right NOW?" (e.g., yes, no, N/A)   - PLAN: "Do you have a specific plan for how you would do this?" (e.g., gun, knife, overdose, no plan, N/A)     no 6. RISK OF HARM - HOMICIDAL IDEATION:  "Do you ever have thoughts of hurting or killing someone else?"  (e.g., yes, no, no but preoccupation  with thoughts about death)   - INTENT:  "Do you have thoughts of hurting or killing someone right NOW?" (e.g., yes, no, N/A)   - PLAN: "Do you have a specific plan for how you would do this?" (e.g., gun, knife, no plan, N/A)      no 7. FUNCTIONAL IMPAIRMENT: "How have things been going for you overall in your life? Have you had any more difficulties than usual doing your normal daily activities?"  (e.g., better, same, worse; self-care, school, work, interactions)     Working very little, "Survival mode" 8. SUPPORT: "Who is with you now?" "Who do you live with?" "Do you have family or friends nearby who you can talk to?"      Supportive friends and family 21. THERAPIST: "Do you have a counselor or therapist? Name?"     Was seeing one. Not recently 10. STRESSORS: "Has there been any new stress or recent changes in your life?"       Yes, custody battle, divorce. 11. CAFFEINE ABUSE: "Do you drink caffeinated beverages, and how much each day?" (e.g., coffee, tea, colas)       no 12. SUBSTANCE ABUSE: "Do you use any illegal drugs or alcohol?"       no 13. OTHER SYMPTOMS: "Do you have any other physical symptoms right now?" (e.g., chest pain, palpitations, difficulty breathing, fever)      BP been trending higher, headaches  Protocols used: ANXIETY AND PANIC ATTACK-A-AH

## 2018-08-07 ENCOUNTER — Encounter: Payer: Self-pay | Admitting: Internal Medicine

## 2018-08-07 ENCOUNTER — Ambulatory Visit: Payer: BLUE CROSS/BLUE SHIELD | Admitting: Internal Medicine

## 2018-08-07 VITALS — BP 122/68 | HR 90 | Temp 98.1°F | Resp 14 | Ht 64.0 in | Wt 165.5 lb

## 2018-08-07 DIAGNOSIS — F419 Anxiety disorder, unspecified: Secondary | ICD-10-CM

## 2018-08-07 DIAGNOSIS — I10 Essential (primary) hypertension: Secondary | ICD-10-CM

## 2018-08-07 DIAGNOSIS — G44209 Tension-type headache, unspecified, not intractable: Secondary | ICD-10-CM | POA: Diagnosis not present

## 2018-08-07 MED ORDER — ALPRAZOLAM 0.25 MG PO TABS: 0.2500 mg | ORAL_TABLET | Freq: Every evening | ORAL | 0 refills | Status: DC | PRN

## 2018-08-07 MED ORDER — ESCITALOPRAM OXALATE 10 MG PO TABS: 10.0000 mg | ORAL_TABLET | Freq: Every day | ORAL | 1 refills | Status: DC

## 2018-08-07 NOTE — Patient Instructions (Signed)
   GO TO THE FRONT DESK Schedule your next appointment for a  Follow up in 3-4 weeks

## 2018-08-07 NOTE — Progress Notes (Signed)
Pre visit review using our clinic review tool, if applicable. No additional management support is needed unless otherwise documented below in the visit note. 

## 2018-08-07 NOTE — Progress Notes (Signed)
Subjective:    Patient ID: Maria Bell, female    DOB: May 30, 1971, 47 y.o.   MRN: [id number]  DOS:  08/07/2018 Type of visit - description : acute Interval history: The patient has been anxious for several years due to a divorce. Back in 09-08-2024she started to receive death threats from her former husband  Since then her anxiety has increased significantly, she feels scared, cannot sleep, headaches have increased as well. "Almost like if I have PTSD". She started to fear for her and her 21-year-old son.  She already notified police, she has somebody else living at home with her and she feels safer.  Headaches:  go from the upper thorax to the top of the head, on and off, much worse in times of high stress. Occasionally associated with nausea, no phono or photophobia, not the worst headache of her life.  Her gynecologist prescribed Valium which he takes a very small dose at night.   Review of Systems  Denies suicidal ideas Past Medical History:  Diagnosis Date  . Panic attack   . Pregnancy induced hypertension   . Pregnancy-induced diabetes     Past Surgical History:  Procedure Laterality Date  . ABDOMINOPLASTY    . APPENDECTOMY    . BREAST REDUCTION SURGERY    . CESAREAN SECTION      Social History   Socioeconomic History  . Marital status: Married    Spouse name: Not on file  . Number of children: Not on file  . Years of education: Not on file  . Highest education level: Not on file  Occupational History  . Not on file  Social Needs  . Financial resource strain: Not on file  . Food insecurity:    Worry: Not on file    Inability: Not on file  . Transportation needs:    Medical: Not on file    Non-medical: Not on file  Tobacco Use  . Smoking status: Former Smoker    Types: Cigarettes  . Smokeless tobacco: Never Used  Substance and Sexual Activity  . Alcohol use: Yes    Comment: daily  . Drug use: Never  . Sexual activity: Not on file  Lifestyle  .  Physical activity:    Days per week: Not on file    Minutes per session: Not on file  . Stress: Not on file  Relationships  . Social connections:    Talks on phone: Not on file    Gets together: Not on file    Attends religious service: Not on file    Active member of club or organization: Not on file    Attends meetings of clubs or organizations: Not on file    Relationship status: Not on file  . Intimate partner violence:    Fear of current or ex partner: Not on file    Emotionally abused: Not on file    Physically abused: Not on file    Forced sexual activity: Not on file  Other Topics Concern  . Not on file  Social History Narrative  . Not on file      Allergies as of 08/07/2018   No Known Allergies     Medication List        Accurate as of 08/07/18 11:59 PM. Always use your most recent med list.          ALPRAZolam 0.25 MG tablet Commonly known as:  XANAX Take 1-2 tablets (0.25-0.5 mg total) by mouth at  bedtime as needed for anxiety or sleep.   escitalopram 10 MG tablet Commonly known as:  LEXAPRO Take 1 tablet (10 mg total) by mouth daily.   estradiol-norethindrone 0.05-0.14 MG/DAY Commonly known as:  COMBIPATCH Place 1 patch onto the skin 2 (two) times a week.   labetalol 200 MG tablet Commonly known as:  NORMODYNE Take 200 mg by mouth 2 (two) times daily.          Objective:   Physical Exam BP 122/68 (BP Location: Left Arm, Patient Position: Sitting, Cuff Size: Small)   Pulse 90   Temp 98.1 F (36.7 C) (Oral)   Resp 14   Ht 5\' 4"  (1.626 m)   Wt 165 lb 8 oz (75.1 kg)   SpO2 98%   BMI 28.41 kg/m  General:   Well developed, NAD, see BMI.  HEENT:  Normocephalic . Face symmetric, atraumatic Neck: Full range of motion, no TTP at the cervical spine. Skin: Not pale. Not jaundice Neurologic:  alert & oriented X3.  Speech normal, gait appropriate for age and unassisted EOMI.  Motor symmetric. Psych--  Cognition and judgment appear intact.    Cooperative with normal attention span and concentration.  Behavior appropriate. No depressed appearing.  She does seem stressed and anxious.     Assessment & Plan:   47 year old female, PMH includes panic attacks,  HTN and diabetes, on birth control.  Presents with  Severe anxiety, insomnia: Sxs chronic but worse lately, d/t her divorce and recent death threats.  She already contacted police, and she feels safe for now.   Patient is counseled, previously saw one of our counselor and recommend to reach out to her again. In the past she tried Lexapro for depression and it worked well for her without side effects. she also tried a small amount   of Xanax from afriend and it did help significantly with insomnia. Plan: Lexapro 10 mg daily, Xanax nightly as needed.  See prescription. Side effects including suicidality discussed with the patient.d/c valium previously rx by gyn. Headaches: Not the worst of her life, will reassess once anxiety is treated.  Neurological exam is grossly intact. HTN: Started labetalol few weeks ago by her gynecologist for BPs of 150/90.  BP today is very good.    RTC 3 to 4 weeks.  >> 25 min

## 2018-08-18 ENCOUNTER — Other Ambulatory Visit: Payer: Self-pay

## 2018-08-18 MED ORDER — ESCITALOPRAM OXALATE 10 MG PO TABS: 10.0000 mg | ORAL_TABLET | Freq: Every day | ORAL | 0 refills | Status: DC

## 2018-09-25 ENCOUNTER — Other Ambulatory Visit: Payer: Self-pay

## 2019-03-25 ENCOUNTER — Other Ambulatory Visit: Payer: Self-pay | Admitting: Obstetrics and Gynecology

## 2019-03-25 DIAGNOSIS — N6489 Other specified disorders of breast: Secondary | ICD-10-CM

## 2019-04-20 ENCOUNTER — Other Ambulatory Visit: Payer: Self-pay

## 2019-05-04 ENCOUNTER — Other Ambulatory Visit: Payer: BLUE CROSS/BLUE SHIELD

## 2019-05-29 ENCOUNTER — Ambulatory Visit
Admission: RE | Admit: 2019-05-29 | Discharge: 2019-05-29 | Disposition: A | Discharge status: Home / Self Care | Payer: No Typology Code available for payment source | Source: Ambulatory Visit | Attending: Obstetrics and Gynecology | Admitting: Obstetrics and Gynecology

## 2019-05-29 DIAGNOSIS — N6489 Other specified disorders of breast: Secondary | ICD-10-CM

## 2020-01-29 IMAGING — MG DIGITAL DIAGNOSTIC UNILATERAL RIGHT MAMMOGRAM WITH TOMO AND CAD
4 series · 4 of 12 positions shown · non-contrast
Comparison: 03/19/2018 and earlier

CLINICAL DATA: Patient returns after screening study for evaluation
of a possible RIGHT breast asymmetry.

EXAM:
DIGITAL DIAGNOSTIC RIGHT MAMMOGRAM WITH TOMO
ULTRASOUND RIGHT BREAST

[R MLO synth-2D]
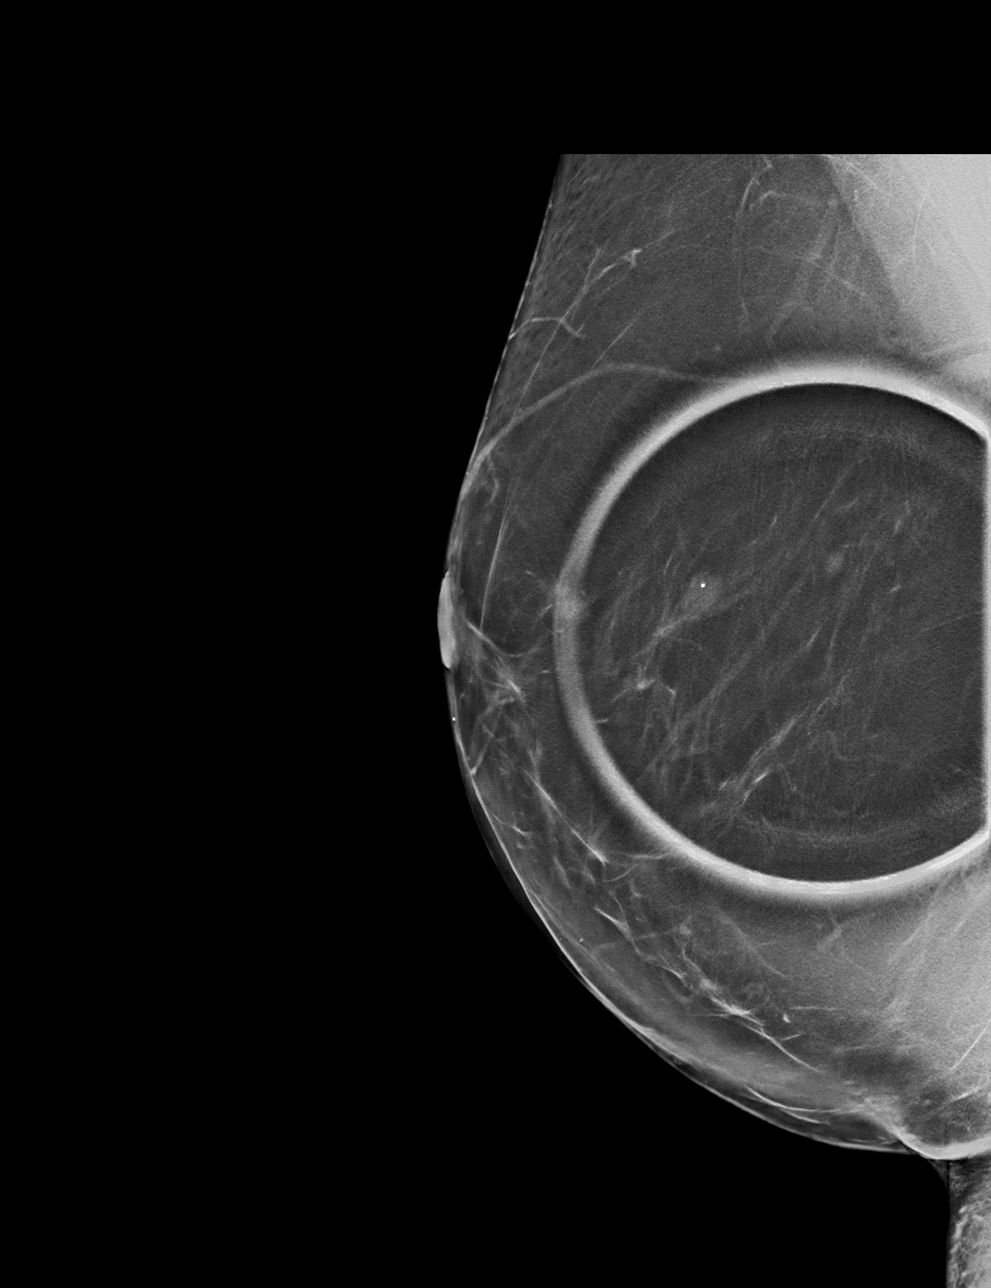

[R CC synth-2D]
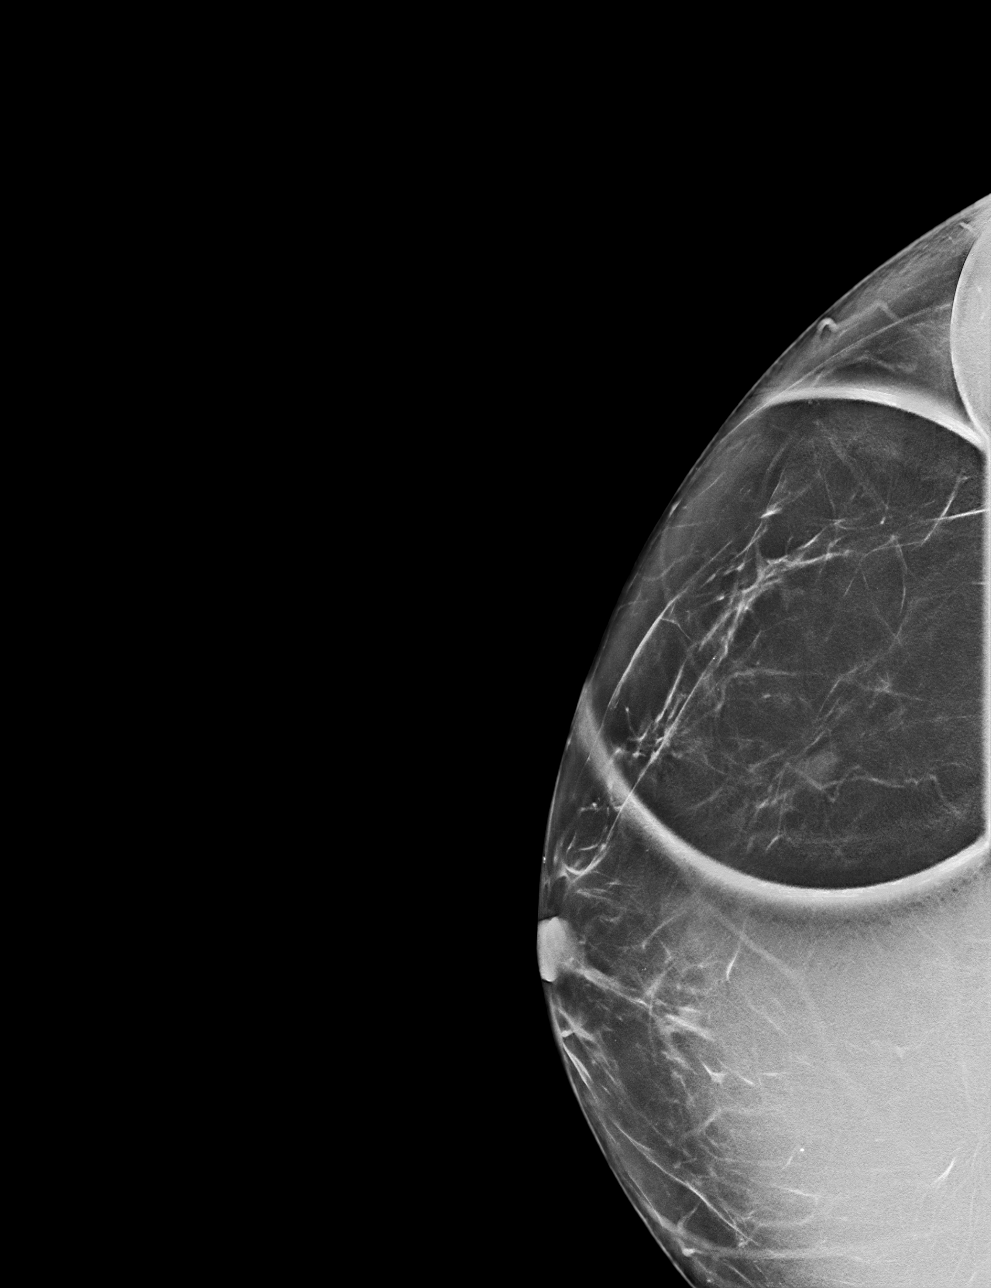

[R CC tomo · tomo slice 40/79.0]
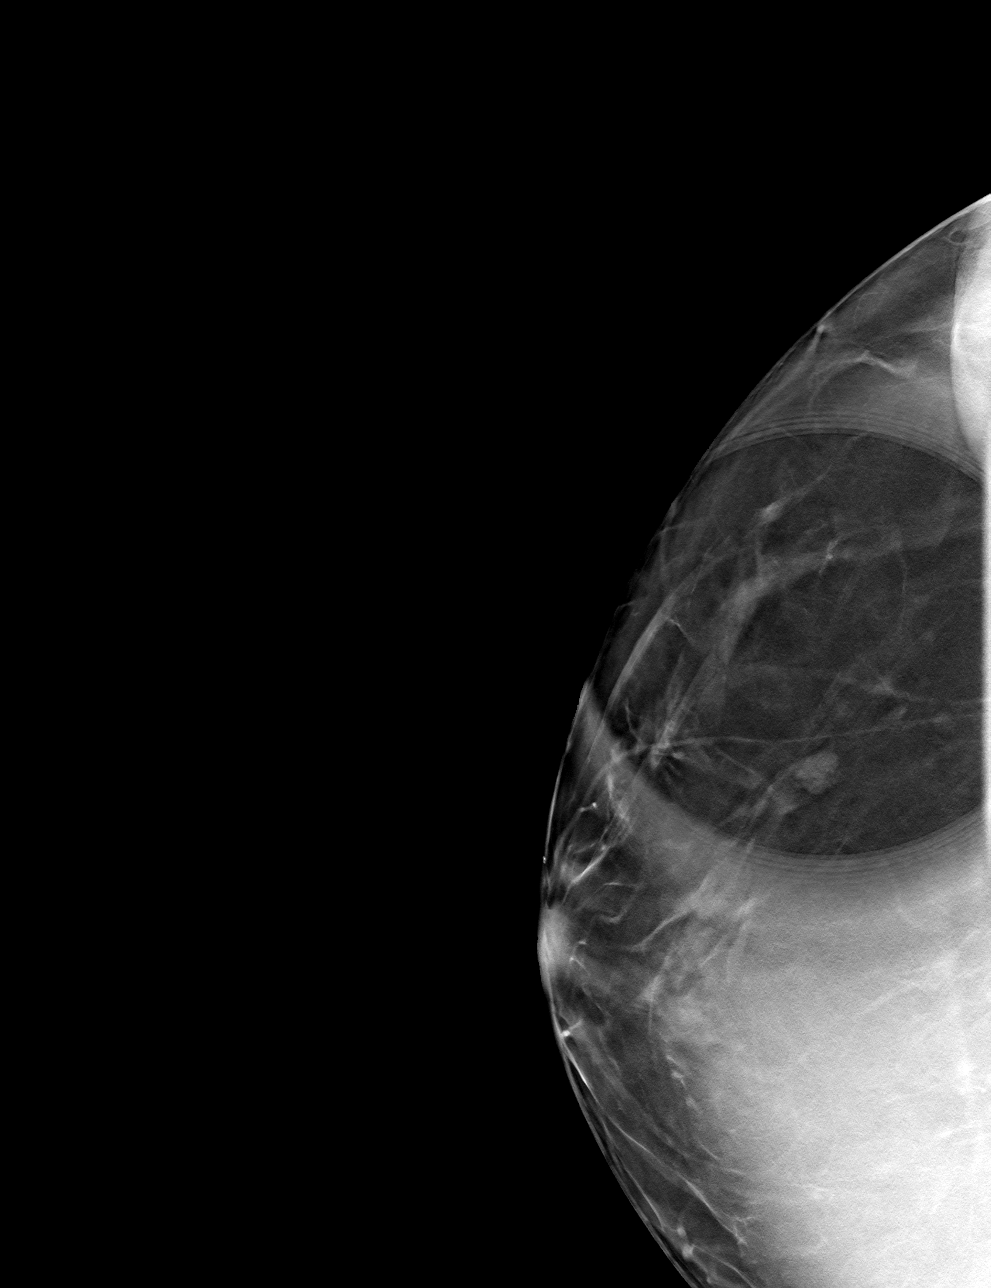

[R MLO tomo · tomo slice 45/88.0]
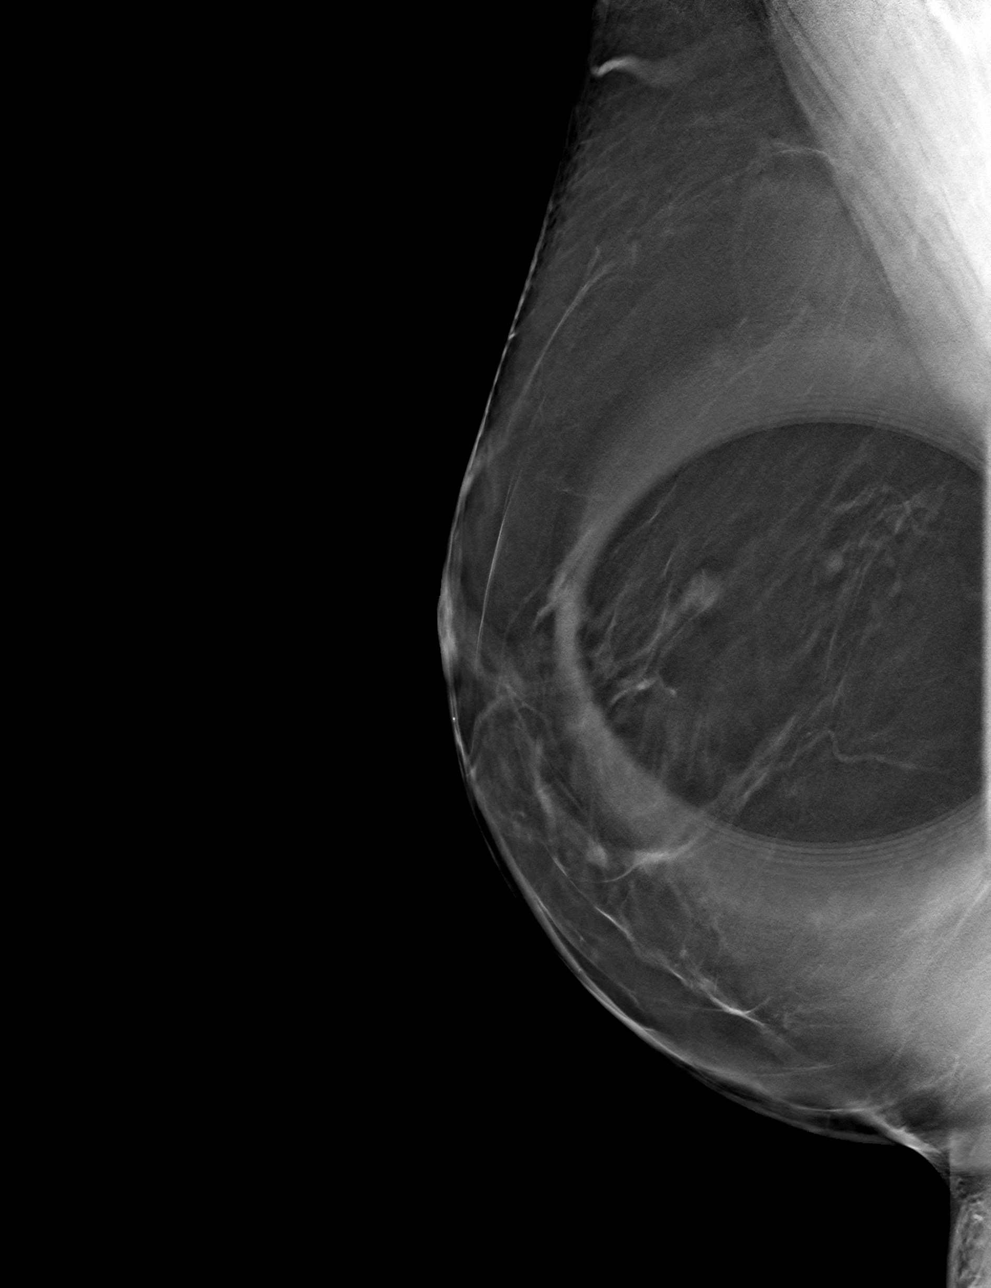

[4 of 12 positions shown; findings below may reference images not displayed]

ACR Breast Density Category b: There are scattered areas of
fibroglandular density.
FINDINGS: Additional 2-D and 3-D images are performed. Views confirm presence
of a small oval circumscribed mass in the LATERAL aspect of the
RIGHT breast and further evaluated with ultrasound.

Targeted ultrasound is performed, showing a circumscribed group of
microcysts in the 9 o'clock location of the RIGHT breast 4
centimeters from nipple which measures 0.6 x 0.3 x 0.7 centimeters.
There is no associated internal blood flow or solid component.
IMPRESSION: Persistent abnormality is a probable area of fibrocystic
change/apocrine metaplasia in the 9 o'clock location of the RIGHT
breast. Follow-up is recommended to document stability.

RECOMMENDATION:
RIGHT breast ultrasound is recommended in 6 months.

I have discussed the findings and recommendations with the patient.
Results were also provided in writing at the conclusion of the
visit. If applicable, a reminder letter will be sent to the patient
regarding the next appointment.

BI-RADS CATEGORY  3: Probably benign.

## 2020-02-28 ENCOUNTER — Other Ambulatory Visit: Payer: Self-pay

## 2020-02-28 ENCOUNTER — Encounter (HOSPITAL_BASED_OUTPATIENT_CLINIC_OR_DEPARTMENT_OTHER): Payer: Self-pay | Admitting: Emergency Medicine

## 2020-02-28 ENCOUNTER — Emergency Department (HOSPITAL_BASED_OUTPATIENT_CLINIC_OR_DEPARTMENT_OTHER): Payer: PRIVATE HEALTH INSURANCE

## 2020-02-28 ENCOUNTER — Emergency Department (HOSPITAL_BASED_OUTPATIENT_CLINIC_OR_DEPARTMENT_OTHER)
Admission: EM | Admit: 2020-02-28 | Discharge: 2020-02-28 | Disposition: A | Discharge status: Home / Self Care | Payer: PRIVATE HEALTH INSURANCE | Attending: Emergency Medicine | Admitting: Emergency Medicine

## 2020-02-28 ENCOUNTER — Emergency Department (HOSPITAL_BASED_OUTPATIENT_CLINIC_OR_DEPARTMENT_OTHER)
Admission: EM | Admit: 2020-02-28 | Discharge: 2020-02-28 | Disposition: A | Discharge status: Home / Self Care | Payer: No Typology Code available for payment source | Attending: Emergency Medicine | Admitting: Emergency Medicine

## 2020-02-28 DIAGNOSIS — R140 Abdominal distension (gaseous): Secondary | ICD-10-CM | POA: Insufficient documentation

## 2020-02-28 DIAGNOSIS — Z87891 Personal history of nicotine dependence: Secondary | ICD-10-CM | POA: Insufficient documentation

## 2020-02-28 DIAGNOSIS — Z793 Long term (current) use of hormonal contraceptives: Secondary | ICD-10-CM | POA: Insufficient documentation

## 2020-02-28 DIAGNOSIS — Z5321 Procedure and treatment not carried out due to patient leaving prior to being seen by health care provider: Secondary | ICD-10-CM | POA: Insufficient documentation

## 2020-02-28 DIAGNOSIS — R1013 Epigastric pain: Secondary | ICD-10-CM | POA: Insufficient documentation

## 2020-02-28 DIAGNOSIS — Z79899 Other long term (current) drug therapy: Secondary | ICD-10-CM | POA: Insufficient documentation

## 2020-02-28 DIAGNOSIS — K8020 Calculus of gallbladder without cholecystitis without obstruction: Secondary | ICD-10-CM

## 2020-02-28 DIAGNOSIS — R1011 Right upper quadrant pain: Secondary | ICD-10-CM

## 2020-02-28 LAB — URINALYSIS, ROUTINE W REFLEX MICROSCOPIC
Bilirubin Urine: NEGATIVE
Glucose, UA: NEGATIVE mg/dL
Hgb urine dipstick: NEGATIVE
Ketones, ur: NEGATIVE mg/dL
Leukocytes,Ua: NEGATIVE
Nitrite: NEGATIVE
Protein, ur: NEGATIVE mg/dL
Specific Gravity, Urine: 1.015 (ref 1.005–1.030)
pH: >9 — ABNORMAL HIGH (ref 5.0–8.0)

## 2020-02-28 LAB — CBC WITH DIFFERENTIAL/PLATELET
Abs Immature Granulocytes: 0.02 10*3/uL (ref 0.00–0.07)
Basophils Absolute: 0 10*3/uL (ref 0.0–0.1)
Basophils Relative: 1 %
Eosinophils Absolute: 0 10*3/uL (ref 0.0–0.5)
Eosinophils Relative: 1 %
HCT: 43 % (ref 36.0–46.0)
Hemoglobin: 14.9 g/dL (ref 12.0–15.0)
Immature Granulocytes: 0 %
Lymphocytes Relative: 21 %
Lymphs Abs: 1.3 10*3/uL (ref 0.7–4.0)
MCH: 32 pg (ref 26.0–34.0)
MCHC: 34.7 g/dL (ref 30.0–36.0)
MCV: 92.5 fL (ref 80.0–100.0)
Monocytes Absolute: 0.4 10*3/uL (ref 0.1–1.0)
Monocytes Relative: 6 %
Neutro Abs: 4.6 10*3/uL (ref 1.7–7.7)
Neutrophils Relative %: 71 %
Platelets: 191 10*3/uL (ref 150–400)
RBC: 4.65 MIL/uL (ref 3.87–5.11)
RDW: 12.5 % (ref 11.5–15.5)
WBC: 6.4 10*3/uL (ref 4.0–10.5)
nRBC: 0 % (ref 0.0–0.2)

## 2020-02-28 LAB — COMPREHENSIVE METABOLIC PANEL
ALT: 30 U/L (ref 0–44)
AST: 19 U/L (ref 15–41)
Albumin: 4.4 g/dL (ref 3.5–5.0)
Alkaline Phosphatase: 67 U/L (ref 38–126)
Anion gap: 10 (ref 5–15)
BUN: 11 mg/dL (ref 6–20)
CO2: 30 mmol/L (ref 22–32)
Calcium: 9.7 mg/dL (ref 8.9–10.3)
Chloride: 102 mmol/L (ref 98–111)
Creatinine, Ser: 0.8 mg/dL (ref 0.44–1.00)
GFR calc Af Amer: >60 mL/min (ref >60)
GFR calc non Af Amer: >60 mL/min (ref >60)
Glucose, Bld: 108 mg/dL — ABNORMAL HIGH (ref 70–99)
Potassium: 3.7 mmol/L (ref 3.5–5.1)
Sodium: 142 mmol/L (ref 135–145)
Total Bilirubin: 0.8 mg/dL (ref 0.3–1.2)
Total Protein: 7 g/dL (ref 6.5–8.1)

## 2020-02-28 LAB — LIPASE, BLOOD: Lipase: 25 U/L (ref 11–51)

## 2020-02-28 MED ORDER — FENTANYL CITRATE (PF) 100 MCG/2ML IJ SOLN
100.0000 ug | INTRAMUSCULAR | Status: DC | PRN
  Administered 2020-02-28: 100 ug via INTRAVENOUS
  Filled 2020-02-28: qty 2

## 2020-02-28 MED ORDER — ONDANSETRON 4 MG PO TBDP: 4.0000 mg | ORAL_TABLET | Freq: Three times a day (TID) | ORAL | 0 refills | Status: DC | PRN

## 2020-02-28 MED ORDER — HYDROCODONE-ACETAMINOPHEN 5-325 MG PO TABS
1.0000 | ORAL_TABLET | Freq: Once | ORAL | Status: AC
  Administered 2020-02-28: 1 via ORAL
  Filled 2020-02-28: qty 1

## 2020-02-28 MED ORDER — ONDANSETRON HCL 4 MG/2ML IJ SOLN: 4.0000 mg | Freq: Once | INTRAMUSCULAR | Status: DC | PRN

## 2020-02-28 MED ORDER — FENTANYL CITRATE (PF) 100 MCG/2ML IJ SOLN: 50.0000 ug | Freq: Once | INTRAMUSCULAR | Status: DC

## 2020-02-28 MED ORDER — OXYCODONE-ACETAMINOPHEN 5-325 MG PO TABS: 1.0000 | ORAL_TABLET | ORAL | 0 refills | Status: DC | PRN

## 2020-02-28 MED ORDER — ONDANSETRON 4 MG PO TBDP
4.0000 mg | ORAL_TABLET | Freq: Once | ORAL | Status: AC | PRN
  Administered 2020-02-28: 07:00:00 4 mg via ORAL
  Filled 2020-02-28: qty 1

## 2020-02-28 NOTE — ED Triage Notes (Signed)
Pt c/o abd pain (epigastric) and distention. Husband says "its 3x the size it normally is". LBM yesterday which was normal for her. Denies n/v/d or fever.

## 2020-02-28 NOTE — ED Notes (Signed)
At end of discharge pt asked about how long she would be in ED. Pt told that ED is busy and it would probably be a while due to volume. Pt stated " I cant sit here all night, I'll just go somewhere else". RN encouraged pt to stay to be seen. Pt maintains she cannot sit and wait.

## 2020-02-28 NOTE — ED Provider Notes (Signed)
[facility] Provider Note: Lowella Dell, MD, FACEP  CSN: [id number] MRN: [id number] ARRIVAL: 02/28/20 at 0531 ROOM: [facility]   CHIEF COMPLAINT  Abdominal Pain   HISTORY OF PRESENT ILLNESS  02/28/20 6:30 AM Maria Bell is a 49 y.o. female who is here with right upper quadrant abdominal pain since noon yesterday.  She describes the pain as a tightness with a cramping element as well.  It has been fairly constant since onset.  She has had some nausea which she attributes to pain but no vomiting or diarrhea.  She rates the pain is a 9 out of 10 and it is worse with movement or palpation.  She has not taken anything for her pain apart from the application of a heating pad.   Past Medical History:  Diagnosis Date  . Panic attack   . Pregnancy induced hypertension   . Pregnancy-induced diabetes    gestational    Past Surgical History:  Procedure Laterality Date  . ABDOMINOPLASTY    . APPENDECTOMY    . BREAST REDUCTION SURGERY    . CESAREAN SECTION      Family History  Problem Relation Age of Onset  . Heart disease Father   . Breast cancer Maternal Grandmother     Social History   Tobacco Use  . Smoking status: Former Smoker    Types: Cigarettes  . Smokeless tobacco: Never Used  Substance Use Topics  . Alcohol use: Yes    Comment: daily  . Drug use: Never    Prior to Admission medications   Medication Sig Start Date End Date Taking? Authorizing Provider  ALPRAZolam (XANAX) 0.25 MG tablet Take 1-2 tablets (0.25-0.5 mg total) by mouth at bedtime as needed for anxiety or sleep. 08/07/18   Wanda Plump, MD  escitalopram (LEXAPRO) 10 MG tablet Take 1 tablet (10 mg total) by mouth daily. 08/18/18   Wanda Plump, MD  estradiol-norethindrone [facility]) 0.05-0.14 MG/DAY Place 1 patch onto the skin 2 (two) times a week.    [provider]  labetalol (NORMODYNE) 200 MG tablet Take 200 mg by mouth 2 (two) times daily.    [provider]    ondansetron (ZOFRAN ODT) 4 MG disintegrating tablet Take 1 tablet (4 mg total) by mouth every 8 (eight) hours as needed for nausea or vomiting. 02/28/20   Little, Ambrose Finland, MD  oxyCODONE-acetaminophen (PERCOCET) 5-325 MG tablet Take 1 tablet by mouth every 4 (four) hours as needed. 02/28/20   Little, Ambrose Finland, MD    Allergies Patient has no known allergies.   REVIEW OF SYSTEMS  Negative except as noted here or in the History of Present Illness.   PHYSICAL EXAMINATION  Initial Vital Signs Blood pressure (!) 137/93, pulse 79, temperature 98.7 F (37.1 C), temperature source Oral, resp. rate 19, height 5\' 4"  (1.626 m), weight 76.2 kg, SpO2 98 %.  Examination General: Well-developed, well-nourished female in no acute distress; appearance consistent with age of record HENT: normocephalic; atraumatic Eyes: Normal appearance Neck: supple Heart: regular rate and rhythm Lungs: clear to auscultation bilaterally Abdomen: soft; nondistended; right upper quadrant tenderness; no masses or hepatosplenomegaly; bowel sounds hyperactive; gallstones seen on bedside ultrasound Extremities: No deformity; full range of motion; pulses normal Neurologic: Awake, alert and oriented; motor function intact in all extremities and symmetric; no facial droop Skin: Warm and dry Psychiatric: Normal mood and affect   RESULTS  Summary of this visit's results, reviewed and interpreted by myself:   EKG  Interpretation  Date/Time:    Ventricular Rate:    PR Interval:    QRS Duration:   QT Interval:    QTC Calculation:   R Axis:     Text Interpretation:        Laboratory Studies: Results for orders placed or performed during the hospital encounter of 02/28/20 (from the past 24 hour(s))  Lipase, blood     Status: None   Collection Time: 02/28/20  6:40 AM  Result Value Ref Range   Lipase 25 11 - 51 U/L  Comprehensive metabolic panel     Status: Abnormal   Collection Time: 02/28/20  6:40 AM   Result Value Ref Range   Sodium 142 135 - 145 mmol/L   Potassium 3.7 3.5 - 5.1 mmol/L   Chloride 102 98 - 111 mmol/L   CO2 30 22 - 32 mmol/L   Glucose, Bld 108 (H) 70 - 99 mg/dL   BUN 11 6 - 20 mg/dL   Creatinine, Ser 0.80 0.44 - 1.00 mg/dL   Calcium 9.7 8.9 - 10.3 mg/dL   Total Protein 7.0 6.5 - 8.1 g/dL   Albumin 4.4 3.5 - 5.0 g/dL   AST 19 15 - 41 U/L   ALT 30 0 - 44 U/L   Alkaline Phosphatase 67 38 - 126 U/L   Total Bilirubin 0.8 0.3 - 1.2 mg/dL   GFR calc non Af Amer >60 >60 mL/min   GFR calc Af Amer >60 >60 mL/min   Anion gap 10 5 - 15  Urinalysis, Routine w reflex microscopic     Status: Abnormal   Collection Time: 02/28/20  6:40 AM  Result Value Ref Range   Color, Urine YELLOW YELLOW   APPearance CLEAR CLEAR   Specific Gravity, Urine 1.015 1.005 - 1.030   pH >9.0 (H) 5.0 - 8.0   Glucose, UA NEGATIVE NEGATIVE mg/dL   Hgb urine dipstick NEGATIVE NEGATIVE   Bilirubin Urine NEGATIVE NEGATIVE   Ketones, ur NEGATIVE NEGATIVE mg/dL   Protein, ur NEGATIVE NEGATIVE mg/dL   Nitrite NEGATIVE NEGATIVE   Leukocytes,Ua NEGATIVE NEGATIVE  CBC with Differential/Platelet     Status: None   Collection Time: 02/28/20  6:40 AM  Result Value Ref Range   WBC 6.4 4.0 - 10.5 K/uL   RBC 4.65 3.87 - 5.11 MIL/uL   Hemoglobin 14.9 12.0 - 15.0 g/dL   HCT 43.0 36.0 - 46.0 %   MCV 92.5 80.0 - 100.0 fL   MCH 32.0 26.0 - 34.0 pg   MCHC 34.7 30.0 - 36.0 g/dL   RDW 12.5 11.5 - 15.5 %   Platelets 191 150 - 400 K/uL   nRBC 0.0 0.0 - 0.2 %   Neutrophils Relative % 71 %   Neutro Abs 4.6 1.7 - 7.7 K/uL   Lymphocytes Relative 21 %   Lymphs Abs 1.3 0.7 - 4.0 K/uL   Monocytes Relative 6 %   Monocytes Absolute 0.4 0.1 - 1.0 K/uL   Eosinophils Relative 1 %   Eosinophils Absolute 0.0 0.0 - 0.5 K/uL   Basophils Relative 1 %   Basophils Absolute 0.0 0.0 - 0.1 K/uL   Immature Granulocytes 0 %   Abs Immature Granulocytes 0.02 0.00 - 0.07 K/uL   Imaging Studies: US Abdomen Limited RUQ  Result  Date: 02/28/2020 CLINICAL DATA:  Severe epigastric pain EXAM: ULTRASOUND ABDOMEN LIMITED RIGHT UPPER QUADRANT COMPARISON:  None. FINDINGS: Gallbladder: Cholelithiasis with the largest measuring 2 cm. Gallbladder sludge is present. No gallbladder wall thickening  or pericholecystic fluid. Negative sonographic Murphy sign. Common bile duct: Diameter: 6 mm Liver: No focal lesion identified. Within normal limits in parenchymal echogenicity. Portal vein is patent on color Doppler imaging with normal direction of blood flow towards the liver. Other: None. IMPRESSION: Cholelithiasis and gallbladder sludge without sonographic evidence of acute cholecystitis. Electronically Signed   By: Elige Ko   On: 02/28/2020 09:45    ED COURSE and MDM  Nursing notes, initial and subsequent vitals signs, including pulse oximetry, reviewed and interpreted by myself.  Vitals:   02/28/20 0610 02/28/20 0611 02/28/20 0902 02/28/20 1050  BP: (!) 137/93  128/90 118/85  Pulse: 79  69 (!) 52  Resp: 19  18 18   Temp: 98.7 F (37.1 C)     TempSrc: Oral     SpO2: 98%  98% 99%  Weight:  76.2 kg    Height:  5\' 4"  (1.626 m)     Medications  ondansetron (ZOFRAN) injection 4 mg (has no administration in time range)  fentaNYL (SUBLIMAZE) injection 50 mcg (has no administration in time range)  fentaNYL (SUBLIMAZE) injection 100 mcg (has no administration in time range)  ondansetron (ZOFRAN-ODT) disintegrating tablet 4 mg (4 mg Oral Given 02/28/20 0631)  HYDROcodone-acetaminophen (NORCO/VICODIN) 5-325 MG per tablet 1 tablet (1 tablet Oral Given 02/28/20 0631)   7:00 AM Signed out to Dr. 03/01/20. 03/01/20 pending.   PROCEDURES  Procedures   ED DIAGNOSES     ICD-10-CM   1. Symptomatic cholelithiasis  K80.20   2. RUQ abdominal pain  R10.11 Clarene Duke Abdomen Limited RUQ    US Abdomen Limited RUQ       Lebert Lovern, MD 02/28/20 2236

## 2020-02-28 NOTE — ED Triage Notes (Signed)
Presents with epigastric pain since noon yesterday. Pt states it feels "tight" and abdomen is distended.

## 2020-02-28 NOTE — ED Provider Notes (Signed)
I received this patient in signout from Dr. Read Drivers.  He had presented with upper abdominal pain after eating dinner and at time of signout awaiting ultrasound, suspicion for gallstones.  Lab work unremarkable with normal LFTs and lipase, normal CBC.  Ultrasound shows cholelithiasis without evidence of cholecystitis and no stones in gallbladder neck.  Patient is well-appearing on reassessment, drinking water, and stating that her symptoms are much improved from earlier.  Given this, I feel she is appropriate for outpatient general surgery follow-up.  I have discussed follow-up plan and supportive measures at home including low-fat diet, small meals, slow advancement of liquids today, and pain/nausea medicine as needed at home.  Reviewed return precautions including intractable pain or fever, instructed to report directly to [facility] or [facility] if symptoms worsen.  She voiced understanding.   Adine Heimann, Ambrose Finland, MD 02/28/20 1040

## 2020-02-28 NOTE — ED Notes (Signed)
Patient transported to Ultrasound 

## 2020-05-23 NOTE — Progress Notes (Addendum)
 1) Facial Rhytids After informed consent and timeout, the patient was prepped in the usual sterile fashion, and Restylane 0.5cc was injected into the following facial areas:   Glabellar                                                   no  Lateral periocular                                     no  Forehead                                                  no  Peri-oral                                                    no  Cheeks                                                     no  IO rims                                                      yes  Malar                                                         no  Lips                                                            no  NL folds                                                     no  ML folds                                                    no  Temples  no   2) s/p BLLB (07/15/2019) -pt would benefit from enhancement of Blepharoplasty of Left lower eyelid  -LLL enhancement to be scheduled  PLAN: Cosmetic injections 4-27mo

## 2020-10-17 ENCOUNTER — Ambulatory Visit (INDEPENDENT_AMBULATORY_CARE_PROVIDER_SITE_OTHER): Payer: Self-pay | Admitting: Psychologist

## 2020-10-17 DIAGNOSIS — F411 Generalized anxiety disorder: Secondary | ICD-10-CM

## 2020-10-17 DIAGNOSIS — F320 Major depressive disorder, single episode, mild: Secondary | ICD-10-CM

## 2020-11-03 ENCOUNTER — Ambulatory Visit (INDEPENDENT_AMBULATORY_CARE_PROVIDER_SITE_OTHER): Payer: Self-pay | Admitting: Psychologist

## 2020-11-03 DIAGNOSIS — F411 Generalized anxiety disorder: Secondary | ICD-10-CM

## 2020-11-03 DIAGNOSIS — F320 Major depressive disorder, single episode, mild: Secondary | ICD-10-CM

## 2020-11-17 ENCOUNTER — Ambulatory Visit: Payer: Self-pay | Admitting: Psychologist

## 2022-03-05 ENCOUNTER — Emergency Department (HOSPITAL_BASED_OUTPATIENT_CLINIC_OR_DEPARTMENT_OTHER)
Admission: EM | Admit: 2022-03-05 | Discharge: 2022-03-05 | Disposition: A | Discharge status: Home / Self Care | Payer: Self-pay | Attending: Emergency Medicine | Admitting: Emergency Medicine

## 2022-03-05 ENCOUNTER — Other Ambulatory Visit: Payer: Self-pay

## 2022-03-05 ENCOUNTER — Encounter (HOSPITAL_BASED_OUTPATIENT_CLINIC_OR_DEPARTMENT_OTHER): Payer: Self-pay

## 2022-03-05 ENCOUNTER — Emergency Department (HOSPITAL_BASED_OUTPATIENT_CLINIC_OR_DEPARTMENT_OTHER): Payer: Self-pay

## 2022-03-05 DIAGNOSIS — I10 Essential (primary) hypertension: Secondary | ICD-10-CM | POA: Insufficient documentation

## 2022-03-05 DIAGNOSIS — R519 Headache, unspecified: Secondary | ICD-10-CM

## 2022-03-05 LAB — BASIC METABOLIC PANEL
Anion gap: 10 (ref 5–15)
BUN: 17 mg/dL (ref 6–20)
CO2: 29 mmol/L (ref 22–32)
Calcium: 9.7 mg/dL (ref 8.9–10.3)
Chloride: 97 mmol/L — ABNORMAL LOW (ref 98–111)
Creatinine, Ser: 1.06 mg/dL — ABNORMAL HIGH (ref 0.44–1.00)
GFR, Estimated: >60 mL/min (ref >60)
Glucose, Bld: 94 mg/dL (ref 70–99)
Potassium: 3.4 mmol/L — ABNORMAL LOW (ref 3.5–5.1)
Sodium: 136 mmol/L (ref 135–145)

## 2022-03-05 LAB — CBC WITH DIFFERENTIAL/PLATELET
Abs Immature Granulocytes: 0.01 10*3/uL (ref 0.00–0.07)
Basophils Absolute: 0 10*3/uL (ref 0.0–0.1)
Basophils Relative: 1 %
Eosinophils Absolute: 0 10*3/uL (ref 0.0–0.5)
Eosinophils Relative: 1 %
HCT: 45.7 % (ref 36.0–46.0)
Hemoglobin: 16.5 g/dL — ABNORMAL HIGH (ref 12.0–15.0)
Immature Granulocytes: 0 %
Lymphocytes Relative: 30 %
Lymphs Abs: 1.9 10*3/uL (ref 0.7–4.0)
MCH: 31.8 pg (ref 26.0–34.0)
MCHC: 36.1 g/dL — ABNORMAL HIGH (ref 30.0–36.0)
MCV: 88.1 fL (ref 80.0–100.0)
Monocytes Absolute: 0.4 10*3/uL (ref 0.1–1.0)
Monocytes Relative: 7 %
Neutro Abs: 4.1 10*3/uL (ref 1.7–7.7)
Neutrophils Relative %: 61 %
Platelets: 207 10*3/uL (ref 150–400)
RBC: 5.19 MIL/uL — ABNORMAL HIGH (ref 3.87–5.11)
RDW: 12.4 % (ref 11.5–15.5)
WBC: 6.5 10*3/uL (ref 4.0–10.5)
nRBC: 0 % (ref 0.0–0.2)

## 2022-03-05 MED ORDER — METOCLOPRAMIDE HCL 10 MG PO TABS: 10.0000 mg | ORAL_TABLET | Freq: Four times a day (QID) | ORAL | 0 refills | Status: AC

## 2022-03-05 MED ORDER — IOHEXOL 350 MG/ML SOLN
75.0000 mL | Freq: Once | INTRAVENOUS | Status: AC | PRN
  Administered 2022-03-05: 75 mL via INTRAVENOUS

## 2022-03-05 MED ORDER — DIPHENHYDRAMINE HCL 50 MG/ML IJ SOLN
50.0000 mg | Freq: Once | INTRAMUSCULAR | Status: AC
  Administered 2022-03-05: 50 mg via INTRAVENOUS
  Filled 2022-03-05: qty 1

## 2022-03-05 MED ORDER — PROCHLORPERAZINE EDISYLATE 10 MG/2ML IJ SOLN
10.0000 mg | Freq: Once | INTRAMUSCULAR | Status: AC
  Administered 2022-03-05: 10 mg via INTRAVENOUS
  Filled 2022-03-05: qty 2

## 2022-03-05 MED ORDER — SODIUM CHLORIDE 0.9 % IV BOLUS
1000.0000 mL | Freq: Once | INTRAVENOUS | Status: AC
  Administered 2022-03-05: 1000 mL via INTRAVENOUS

## 2022-03-05 NOTE — ED Triage Notes (Addendum)
C/o headache, light sensitivity x 3 days. Started hydrochlorothiazide a week ago. BP 210/110 in triage. States fingers and feet tingling intermittently. ? ?Was drinking alcohol daily, recently stopped a week ago.  ?

## 2022-03-05 NOTE — ED Provider Notes (Signed)
?[facility] EMERGENCY DEPARTMENT ?Provider Note ? ? ?CSN: [id number] ?Arrival date & time: 03/05/22  1403 ? ?  ? ?History ? ?Chief Complaint  ?Patient presents with  ? Hypertension  ? Headache  ? ? ?Maria Bell is a 51 y.o. female. ? ?Patient presents to ER chief complaint of headache.  Describes it as throbbing severe pain in the left temporal region rating to the back of the head.  She states symptoms started about 3 days ago it was gradual onset low-grade headache that she noticed 3 days ago and has persistently worsened over the course of 3 days.  She has a history of migraines and states that she has been to the ER in the hospital for migraines before but feels like this 1 is lasting longer and more severe than her typical pattern.  Associated with some nausea but no vomiting no diarrhea.  No fevers no cough reported.  Pain also radiates down her back of her neck today.  No new numbness or weakness per patient.  She had tingling in the left leg intermittently yesterday, but currently denies any symptoms. ? ? ?  ? ?Home Medications ?Prior to Admission medications   ?Medication Sig Start Date End Date Taking? Authorizing Provider  ?ALPRAZolam (XANAX) 0.5 MG tablet Take 0.5 mg by mouth 3 (three) times daily. 01/25/22  Yes [provider]  ?CALCIUM PO Take 2 tablets by mouth daily.   Yes [provider]  ?hydrochlorothiazide (HYDRODIURIL) 50 MG tablet Take 50 mg by mouth daily. 03/01/22  Yes [provider]  ?NON FORMULARY Take 1 capsule by mouth daily. diindolylmethane   Yes [provider]  ?progesterone (PROMETRIUM) 200 MG capsule Take 200 mg by mouth at bedtime. 02/24/22  Yes [provider]  ?Red Yeast Rice Extract (RED YEAST RICE PO) Take 2 capsules by mouth daily.   Yes [provider]  ?VITAMIN D PO Take 1 drop by mouth daily.   Yes [provider]  ?   ? ?Allergies    ?Ketorolac   ? ?Review of Systems   ?Review of Systems   ?Constitutional:  Negative for chills and fever.  ?HENT:  Negative for ear pain and sore throat.   ?Eyes:  Negative for pain and visual disturbance.  ?Respiratory:  Negative for cough and shortness of breath.   ?Cardiovascular:  Negative for chest pain and palpitations.  ?Gastrointestinal:  Negative for abdominal pain and vomiting.  ?Genitourinary:  Negative for dysuria and hematuria.  ?Musculoskeletal:  Negative for arthralgias and back pain.  ?Skin:  Negative for color change and rash.  ?Neurological:  Positive for headaches. Negative for seizures and syncope.  ?All other systems reviewed and are negative. ? ?Physical Exam ?Updated Vital Signs ?BP 108/71   Pulse 70   Temp 98.5 ?F (36.9 ?C)   Resp 18   Ht 5\' 4"  (1.626 m)   Wt 72.6 kg   SpO2 100%   BMI 27.46 kg/m?  ?Physical Exam ?Constitutional:   ?   General: She is not in acute distress. ?   Appearance: Normal appearance.  ?HENT:  ?   Head: Normocephalic.  ?   Nose: Nose normal.  ?Eyes:  ?   Extraocular Movements: Extraocular movements intact.  ?Cardiovascular:  ?   Rate and Rhythm: Normal rate.  ?Pulmonary:  ?   Effort: Pulmonary effort is normal.  ?Musculoskeletal:     ?   General: Normal range of motion.  ?   Cervical back: Normal  range of motion.  ?Neurological:  ?   General: No focal deficit present.  ?   Mental Status: She is alert and oriented to person, place, and time. Mental status is at baseline.  ?   Cranial Nerves: No cranial nerve deficit.  ?   Sensory: No sensory deficit.  ?   Motor: No weakness.  ?   Gait: Gait normal.  ? ? ?ED Results / Procedures / Treatments   ?Labs ?(all labs ordered are listed, but only abnormal results are displayed) ?Labs Reviewed  ?CBC WITH DIFFERENTIAL/PLATELET - Abnormal; Notable for the following components:  ?    Result Value  ? RBC 5.19 (*)   ? Hemoglobin 16.5 (*)   ? MCHC 36.1 (*)   ? All other components within normal limits  ?BASIC METABOLIC PANEL - Abnormal; Notable for the following components:  ?  Potassium 3.4 (*)   ? Chloride 97 (*)   ? Creatinine, Ser 1.06 (*)   ? All other components within normal limits  ? ? ?EKG ?EKG Interpretation ? ?Date/Time:  Monday March 05 2022 14:17:29 EDT ?Ventricular Rate:  87 ?PR Interval:  152 ?QRS Duration: 78 ?QT Interval:  376 ?QTC Calculation: 452 ?R Axis:   56 ?Text Interpretation: Normal sinus rhythm Normal ECG When compared with ECG of 28-Feb-2018 15:40, PREVIOUS ECG IS PRESENT Confirmed by Norman Clay ([id number]) on 03/05/2022 3:10:58 PM ? ?Radiology ?No results found. ? ?Procedures ?Procedures  ? ? ?Medications Ordered in ED ?Medications  ?prochlorperazine (COMPAZINE) injection 10 mg (10 mg Intravenous Given 03/05/22 1541)  ?diphenhydrAMINE (BENADRYL) injection 50 mg (50 mg Intravenous Given 03/05/22 1542)  ?sodium chloride 0.9 % bolus 1,000 mL (1,000 mLs Intravenous New Bag/Given 03/05/22 1538)  ?iohexol (OMNIPAQUE) 350 MG/ML injection 75 mL (75 mLs Intravenous Contrast Given 03/05/22 1657)  ? ? ?ED Course/ Medical Decision Making/ A&P ?  ?                        ?Medical Decision Making ?Amount and/or Complexity of Data Reviewed ?Labs: ordered. ?Radiology: ordered. ? ?Risk ?Prescription drug management. ? ? ?History obtained from patient and her husband at bedside. ? ?Cardiac monitoring, sinus rhythm noted. ? ?Chart review shows recent visit for her OB/GYN doctor 5 months ago. ? ?Dialysis studies included some labs which were unremarkable.  CT angio head and neck pursued with no acute findings noted. ? ?Patient given Compazine and Benadryl with subsequent resolution of headache.  Blood pressure spontaneously resolved as well. ? ?Patient continues have no focal neurodeficit is amatory here without any assistance symptoms have resolved. ? ?Patient discharged home in stable condition advised outpatient follow-up with Dr. This week.  Advised immediate return for recurrent, worsening symptoms, new symptoms or any additional concerns. ? ? ? ? ? ? ? ?Final Clinical Impression(s) / ED  Diagnoses ?Final diagnoses:  ?Acute nonintractable headache, unspecified headache type  ?Hypertension, unspecified type  ? ? ?Rx / DC Orders ?ED Discharge Orders   ? ? None  ? ?  ? ? ?  ?Cheryll Cockayne, MD ?03/05/22 1833 ? ?

## 2022-03-05 NOTE — Discharge Instructions (Signed)
Call your primary care doctor or specialist as discussed in the next 2-3 days.   Return immediately back to the ER if:  Your symptoms worsen within the next 12-24 hours. You develop new symptoms such as new fevers, persistent vomiting, new pain, shortness of breath, or new weakness or numbness, or if you have any other concerns.  

## 2022-03-05 NOTE — ED Notes (Signed)
Ice pack and lights dimmed for headache  ?

## 2022-12-19 DIAGNOSIS — R87612 Low grade squamous intraepithelial lesion on cytologic smear of cervix (LGSIL): Secondary | ICD-10-CM | POA: Diagnosis not present

## 2022-12-19 DIAGNOSIS — R87610 Atypical squamous cells of undetermined significance on cytologic smear of cervix (ASC-US): Secondary | ICD-10-CM | POA: Diagnosis not present

## 2022-12-19 DIAGNOSIS — R110 Nausea: Secondary | ICD-10-CM | POA: Diagnosis not present

## 2022-12-19 DIAGNOSIS — R69 Illness, unspecified: Secondary | ICD-10-CM | POA: Diagnosis not present

## 2022-12-19 DIAGNOSIS — R635 Abnormal weight gain: Secondary | ICD-10-CM | POA: Diagnosis not present

## 2022-12-19 DIAGNOSIS — Z6823 Body mass index (BMI) 23.0-23.9, adult: Secondary | ICD-10-CM | POA: Diagnosis not present

## 2023-07-29 DIAGNOSIS — I10 Essential (primary) hypertension: Secondary | ICD-10-CM | POA: Diagnosis not present

## 2023-07-29 DIAGNOSIS — Z6824 Body mass index (BMI) 24.0-24.9, adult: Secondary | ICD-10-CM | POA: Diagnosis not present

## 2023-07-29 DIAGNOSIS — F419 Anxiety disorder, unspecified: Secondary | ICD-10-CM | POA: Diagnosis not present

## 2023-07-29 DIAGNOSIS — Z01419 Encounter for gynecological examination (general) (routine) without abnormal findings: Secondary | ICD-10-CM | POA: Diagnosis not present

## 2023-07-29 DIAGNOSIS — N951 Menopausal and female climacteric states: Secondary | ICD-10-CM | POA: Diagnosis not present

## 2023-09-12 DIAGNOSIS — N951 Menopausal and female climacteric states: Secondary | ICD-10-CM | POA: Diagnosis not present

## 2023-09-12 DIAGNOSIS — I10 Essential (primary) hypertension: Secondary | ICD-10-CM | POA: Diagnosis not present

## 2023-10-03 ENCOUNTER — Other Ambulatory Visit: Payer: Self-pay | Admitting: Obstetrics and Gynecology

## 2023-10-03 DIAGNOSIS — N63 Unspecified lump in unspecified breast: Secondary | ICD-10-CM

## 2023-10-03 DIAGNOSIS — R928 Other abnormal and inconclusive findings on diagnostic imaging of breast: Secondary | ICD-10-CM

## 2023-10-09 ENCOUNTER — Other Ambulatory Visit: Payer: Self-pay

## 2023-11-12 ENCOUNTER — Other Ambulatory Visit: Payer: Self-pay

## 2023-12-11 ENCOUNTER — Ambulatory Visit: Payer: 59

## 2023-12-11 ENCOUNTER — Ambulatory Visit
Admission: RE | Admit: 2023-12-11 | Discharge: 2023-12-11 | Disposition: A | Discharge status: Home / Self Care | Payer: 59 | Source: Ambulatory Visit | Attending: Obstetrics and Gynecology | Admitting: Obstetrics and Gynecology

## 2023-12-11 DIAGNOSIS — N63 Unspecified lump in unspecified breast: Secondary | ICD-10-CM

## 2024-08-26 ENCOUNTER — Encounter (HOSPITAL_BASED_OUTPATIENT_CLINIC_OR_DEPARTMENT_OTHER): Payer: Self-pay | Admitting: Emergency Medicine

## 2024-08-26 ENCOUNTER — Emergency Department (HOSPITAL_BASED_OUTPATIENT_CLINIC_OR_DEPARTMENT_OTHER)

## 2024-08-26 ENCOUNTER — Observation Stay (HOSPITAL_BASED_OUTPATIENT_CLINIC_OR_DEPARTMENT_OTHER)
Admission: EM | Admit: 2024-08-26 | Discharge: 2024-08-27 | Disposition: A | Discharge status: Home / Self Care | Attending: Surgery | Admitting: Surgery

## 2024-08-26 ENCOUNTER — Other Ambulatory Visit: Payer: Self-pay

## 2024-08-26 DIAGNOSIS — R1084 Generalized abdominal pain: Secondary | ICD-10-CM | POA: Diagnosis not present

## 2024-08-26 DIAGNOSIS — F17210 Nicotine dependence, cigarettes, uncomplicated: Secondary | ICD-10-CM | POA: Diagnosis not present

## 2024-08-26 DIAGNOSIS — R109 Unspecified abdominal pain: Secondary | ICD-10-CM | POA: Diagnosis present

## 2024-08-26 DIAGNOSIS — K8010 Calculus of gallbladder with chronic cholecystitis without obstruction: Secondary | ICD-10-CM | POA: Diagnosis not present

## 2024-08-26 DIAGNOSIS — Z743 Need for continuous supervision: Secondary | ICD-10-CM | POA: Diagnosis not present

## 2024-08-26 DIAGNOSIS — K8020 Calculus of gallbladder without cholecystitis without obstruction: Principal | ICD-10-CM | POA: Diagnosis present

## 2024-08-26 DIAGNOSIS — I10 Essential (primary) hypertension: Secondary | ICD-10-CM | POA: Insufficient documentation

## 2024-08-26 LAB — CBC WITH DIFFERENTIAL/PLATELET
Abs Immature Granulocytes: 0.01 K/uL (ref 0.00–0.07)
Basophils Absolute: 0 K/uL (ref 0.0–0.1)
Basophils Relative: 1 %
Eosinophils Absolute: 0.1 K/uL (ref 0.0–0.5)
Eosinophils Relative: 1 %
HCT: 38.7 % (ref 36.0–46.0)
Hemoglobin: 14.1 g/dL (ref 12.0–15.0)
Immature Granulocytes: 0 %
Lymphocytes Relative: 40 %
Lymphs Abs: 2.3 K/uL (ref 0.7–4.0)
MCH: 33.2 pg (ref 26.0–34.0)
MCHC: 36.4 g/dL — ABNORMAL HIGH (ref 30.0–36.0)
MCV: 91.1 fL (ref 80.0–100.0)
Monocytes Absolute: 0.3 K/uL (ref 0.1–1.0)
Monocytes Relative: 5 %
Neutro Abs: 3.1 K/uL (ref 1.7–7.7)
Neutrophils Relative %: 53 %
Platelets: 181 K/uL (ref 150–400)
RBC: 4.25 MIL/uL (ref 3.87–5.11)
RDW: 12.6 % (ref 11.5–15.5)
WBC: 5.8 K/uL (ref 4.0–10.5)
nRBC: 0 % (ref 0.0–0.2)

## 2024-08-26 LAB — SURGICAL PCR SCREEN
MRSA, PCR: NEGATIVE
Staphylococcus aureus: NEGATIVE

## 2024-08-26 LAB — COMPREHENSIVE METABOLIC PANEL WITH GFR
ALT: 26 U/L (ref 0–44)
AST: 22 U/L (ref 15–41)
Albumin: 4.6 g/dL (ref 3.5–5.0)
Alkaline Phosphatase: 71 U/L (ref 38–126)
Anion gap: 12 (ref 5–15)
BUN: 12 mg/dL (ref 6–20)
CO2: 23 mmol/L (ref 22–32)
Calcium: 9.1 mg/dL (ref 8.9–10.3)
Chloride: 104 mmol/L (ref 98–111)
Creatinine, Ser: 0.89 mg/dL (ref 0.44–1.00)
GFR, Estimated: >60 mL/min (ref >60)
Glucose, Bld: 115 mg/dL — ABNORMAL HIGH (ref 70–99)
Potassium: 3.7 mmol/L (ref 3.5–5.1)
Sodium: 138 mmol/L (ref 135–145)
Total Bilirubin: 0.3 mg/dL (ref 0.0–1.2)
Total Protein: 6.6 g/dL (ref 6.5–8.1)

## 2024-08-26 LAB — HIV ANTIBODY (ROUTINE TESTING W REFLEX): HIV Screen 4th Generation wRfx: NONREACTIVE

## 2024-08-26 LAB — TROPONIN T, HIGH SENSITIVITY
Troponin T High Sensitivity: <15 ng/L (ref 0–19)
Troponin T High Sensitivity: <15 ng/L (ref 0–19)

## 2024-08-26 LAB — D-DIMER, QUANTITATIVE: D-Dimer, Quant: 0.37 ug{FEU}/mL (ref 0.00–0.50)

## 2024-08-26 LAB — LIPASE, BLOOD: Lipase: 25 U/L (ref 11–51)

## 2024-08-26 MED ORDER — LACTATED RINGERS IV SOLN: INTRAVENOUS | Status: AC

## 2024-08-26 MED ORDER — FENTANYL CITRATE PF 50 MCG/ML IJ SOSY
50.0000 ug | PREFILLED_SYRINGE | Freq: Once | INTRAMUSCULAR | Status: AC
  Administered 2024-08-26: 50 ug via INTRAVENOUS
  Filled 2024-08-26: qty 1

## 2024-08-26 MED ORDER — LISINOPRIL 10 MG PO TABS
10.0000 mg | ORAL_TABLET | Freq: Every day | ORAL | Status: DC
Start: 2024-08-26 — End: 2024-08-26
  Filled 2024-08-26: qty 1

## 2024-08-26 MED ORDER — OXYCODONE HCL 5 MG PO TABS
5.0000 mg | ORAL_TABLET | ORAL | Status: DC | PRN
  Administered 2024-08-26: 5 mg via ORAL
  Administered 2024-08-27: 10 mg via ORAL
  Filled 2024-08-26: qty 2
  Filled 2024-08-26: qty 1

## 2024-08-26 MED ORDER — ENOXAPARIN SODIUM 40 MG/0.4ML IJ SOSY: 40.0000 mg | PREFILLED_SYRINGE | INTRAMUSCULAR | Status: DC

## 2024-08-26 MED ORDER — ONDANSETRON HCL 4 MG/2ML IJ SOLN
4.0000 mg | Freq: Once | INTRAMUSCULAR | Status: AC
  Administered 2024-08-26: 4 mg via INTRAVENOUS
  Filled 2024-08-26: qty 2

## 2024-08-26 MED ORDER — DIPHENHYDRAMINE HCL 25 MG PO CAPS: 25.0000 mg | ORAL_CAPSULE | Freq: Four times a day (QID) | ORAL | Status: DC | PRN

## 2024-08-26 MED ORDER — LORAZEPAM 2 MG/ML IJ SOLN: 0.5000 mg | Freq: Once | INTRAMUSCULAR | Status: DC

## 2024-08-26 MED ORDER — ONDANSETRON 4 MG PO TBDP: 4.0000 mg | ORAL_TABLET | Freq: Four times a day (QID) | ORAL | Status: DC | PRN

## 2024-08-26 MED ORDER — ESTRADIOL 0.05 MG/24HR TD PTWK
0.0500 mg | MEDICATED_PATCH | TRANSDERMAL | Status: DC
Start: 2024-08-27 — End: 2024-08-27
  Filled 2024-08-26: qty 1

## 2024-08-26 MED ORDER — POTASSIUM CHLORIDE IN NACL 20-0.9 MEQ/L-% IV SOLN
INTRAVENOUS | Status: AC
  Filled 2024-08-26 (×2): qty 1000

## 2024-08-26 MED ORDER — ALPRAZOLAM 0.5 MG PO TABS
0.5000 mg | ORAL_TABLET | Freq: Three times a day (TID) | ORAL | Status: DC
  Administered 2024-08-26 – 2024-08-27 (×4): 0.5 mg via ORAL
  Filled 2024-08-26 (×4): qty 1

## 2024-08-26 MED ORDER — ACETAMINOPHEN 500 MG PO TABS
1000.0000 mg | ORAL_TABLET | Freq: Four times a day (QID) | ORAL | Status: DC
Start: 2024-08-26 — End: 2024-08-27
  Administered 2024-08-26 – 2024-08-27 (×5): 1000 mg via ORAL
  Filled 2024-08-26 (×5): qty 2

## 2024-08-26 MED ORDER — CHLORHEXIDINE GLUCONATE 0.12 % MT SOLN
15.0000 mL | Freq: Once | OROMUCOSAL | Status: AC
  Administered 2024-08-26: 15 mL via OROMUCOSAL

## 2024-08-26 MED ORDER — DIPHENHYDRAMINE HCL 50 MG/ML IJ SOLN: 25.0000 mg | Freq: Four times a day (QID) | INTRAMUSCULAR | Status: DC | PRN

## 2024-08-26 MED ORDER — HYDROMORPHONE HCL 1 MG/ML IJ SOLN
0.5000 mg | Freq: Once | INTRAMUSCULAR | Status: AC
  Administered 2024-08-26: 0.5 mg via INTRAVENOUS
  Filled 2024-08-26: qty 1

## 2024-08-26 MED ORDER — SODIUM CHLORIDE 0.9 % IV SOLN
2.0000 g | INTRAVENOUS | Status: DC
  Filled 2024-08-26: qty 20

## 2024-08-26 MED ORDER — ONDANSETRON HCL 4 MG/2ML IJ SOLN
4.0000 mg | Freq: Four times a day (QID) | INTRAMUSCULAR | Status: DC | PRN
  Administered 2024-08-26: 4 mg via INTRAVENOUS
  Filled 2024-08-26: qty 2

## 2024-08-26 MED ORDER — IOHEXOL 350 MG/ML SOLN
100.0000 mL | Freq: Once | INTRAVENOUS | Status: AC | PRN
  Administered 2024-08-26: 100 mL via INTRAVENOUS

## 2024-08-26 MED ORDER — ESTRADIOL 0.5 MG PO TABS
1.0000 mg | ORAL_TABLET | Freq: Every day | ORAL | Status: DC
  Filled 2024-08-26: qty 2

## 2024-08-26 MED ORDER — HYDROMORPHONE HCL 1 MG/ML IJ SOLN
1.0000 mg | INTRAMUSCULAR | Status: DC | PRN
  Administered 2024-08-26: 1 mg via INTRAVENOUS
  Filled 2024-08-26: qty 1

## 2024-08-26 NOTE — Anesthesia Preprocedure Evaluation (Signed)
 Anesthesia Evaluation  Patient identified by MRN, date of birth, ID band Patient awake    Reviewed: Allergy & Precautions, NPO status , Patient's Chart, lab work & pertinent test results  History of Anesthesia Complications Negative for: history of anesthetic complications  Airway Mallampati: II  TM Distance: >3 FB Neck ROM: Full    Dental  (+) Dental Advisory Given, Caps   Pulmonary neg shortness of breath, neg COPD, Current Smoker and Patient abstained from smoking.   breath sounds clear to auscultation       Cardiovascular hypertension, Pt. on medications  Rhythm:Regular Rate:Normal     Neuro/Psych   Anxiety     negative neurological ROS     GI/Hepatic negative GI ROS, Neg liver ROS,,,  Endo/Other  Diabetes: 14 years ago.    Renal/GU negative Renal ROS     Musculoskeletal   Abdominal   Peds  Hematology Hb 14.1, plt 181k   Anesthesia Other Findings   Reproductive/Obstetrics                              Anesthesia Physical Anesthesia Plan  ASA: 2  Anesthesia Plan: General   Post-op Pain Management: Tylenol  PO (pre-op)*   Induction: Intravenous  PONV Risk Score and Plan: 2 and Ondansetron  and Dexamethasone   Airway Management Planned: Oral ETT  Additional Equipment: None  Intra-op Plan:   Post-operative Plan: Extubation in OR  Informed Consent: I have reviewed the patients History and Physical, chart, labs and discussed the procedure including the risks, benefits and alternatives for the proposed anesthesia with the patient or authorized representative who has indicated his/her understanding and acceptance.     Dental advisory given  Plan Discussed with: CRNA and Surgeon  Anesthesia Plan Comments:          Anesthesia Quick Evaluation

## 2024-08-26 NOTE — Progress Notes (Signed)
   08/26/24 1040  TOC Brief Assessment  Insurance and Status Reviewed  Patient has primary care physician Yes  Home environment has been reviewed resides in a private residence  Prior level of function: Independent  Prior/Current Home Services No current home services  Social Drivers of Health Review SDOH reviewed no interventions necessary  Readmission risk has been reviewed Yes  Transition of care needs no transition of care needs at this time

## 2024-08-26 NOTE — OR Nursing (Signed)
 Per PA and patient surgery delayed until tomorrow 08/27/24.  Handoff given to nurse on floor. Patient transported back to floor in w/c. Husband at patient side.

## 2024-08-26 NOTE — H&P (Signed)
 Maria Bell [facility] 1971/10/05  [id number].    Chief Complaint/Reason for Consult: biliary colic  HPI:  This is a 53 yo female with a history of anxiety, mild tobacco abuse, who was having some epigastric discomfort last night after eating some popcorn.  She took some reflux medication and was able to go to sleep.  She awoke around 0200am with severe epigastric abdominal pain that radiated to her back and RUQ.  She had mild nausea, but no vomiting.  No fevers, chest pain, or SOB.  She has had similar symptoms about 3 years ago when she was found to have gallstones.  Really has hadn't any symptoms since then until last night.  She presented to [facility] ED for evaluation.  Her labs are unremarkable, but she does have a CTA that reveals gallstones.  No other findings.  We have been asked to see her for persistent RUQ abdominal discomfort.  ROS: ROS: see HPI, otherwise has had a headache for about 2 weeks  Family History  Problem Relation Age of Onset   Heart disease Father    Breast cancer Maternal Grandmother     Past Medical History:  Diagnosis Date   Panic attack    Pregnancy induced hypertension    Pregnancy-induced diabetes    gestational    Past Surgical History:  Procedure Laterality Date   ABDOMINOPLASTY     APPENDECTOMY     BREAST REDUCTION SURGERY     CESAREAN SECTION     REDUCTION MAMMAPLASTY Bilateral     Social History:  reports that she has quit smoking. Her smoking use included cigarettes. She has never used smokeless tobacco. She reports current alcohol use. She reports that she does not use drugs.  Allergies:  Allergies  Allergen Reactions   Ketorolac  Other (See Comments)    Insensitivity per patient    Medications Prior to Admission  Medication Sig Dispense Refill   ALPRAZolam  (XANAX ) 0.5 MG tablet Take 0.5 mg by mouth 3 (three) times daily.     estradiol  (ESTRACE ) 1 MG tablet Take 1 tablet by mouth daily.     estradiol  (VIVELLE -DOT) 0.05 MG/24HR patch  Place 1 patch onto the skin 2 (two) times a week.     lisinopril  (ZESTRIL ) 10 MG tablet Take 10 mg by mouth daily.     VITAMIN D PO Take 1 drop by mouth daily.     metoCLOPramide  (REGLAN ) 10 MG tablet Take 1 tablet (10 mg total) by mouth every 6 (six) hours for 10 doses. (Patient not taking: Reported on 08/26/2024) 10 tablet 0     Physical Exam: Blood pressure 127/80, pulse (!) 54, temperature (!) 97.5 F (36.4 C), temperature source Oral, resp. rate 18, height 5' 4 (1.626 m), weight 73 kg, SpO2 98%. General: pleasant, but a bit anxious, WD, WN white female who is laying in bed in NAD HEENT: head is normocephalic, atraumatic.  Sclera are noninjected.  PERRL.  Ears and nose without any masses or lesions.  Mouth is pink and moist Heart: regular, rate, and rhythm.  Normal s1,s2. No obvious murmurs, gallops, or rubs noted.  Palpable radial and pedal pulses bilaterally Lungs: CTAB, no wheezes, rhonchi, or rales noted.  Respiratory effort nonlabored Abd: soft, tender in epigastrium and some in her RUQ, ND, +BS, no masses, hernias, or organomegaly.  Several scars from her previous abdominoplasty Psych: A&Ox3 with an appropriate affect.   Results for orders placed or performed during the hospital encounter of 08/26/24 (from the past  48 hours)  CBC with Differential     Status: Abnormal   Collection Time: 08/26/24  2:34 AM  Result Value Ref Range   WBC 5.8 4.0 - 10.5 K/uL   RBC 4.25 3.87 - 5.11 MIL/uL   Hemoglobin 14.1 12.0 - 15.0 g/dL   HCT 61.2 63.9 - 53.9 %   MCV 91.1 80.0 - 100.0 fL   MCH 33.2 26.0 - 34.0 pg   MCHC 36.4 (H) 30.0 - 36.0 g/dL   RDW 87.3 88.4 - 84.4 %   Platelets 181 150 - 400 K/uL   nRBC 0.0 0.0 - 0.2 %   Neutrophils Relative % 53 %   Neutro Abs 3.1 1.7 - 7.7 K/uL   Lymphocytes Relative 40 %   Lymphs Abs 2.3 0.7 - 4.0 K/uL   Monocytes Relative 5 %   Monocytes Absolute 0.3 0.1 - 1.0 K/uL   Eosinophils Relative 1 %   Eosinophils Absolute 0.1 0.0 - 0.5 K/uL   Basophils  Relative 1 %   Basophils Absolute 0.0 0.0 - 0.1 K/uL   Immature Granulocytes 0 %   Abs Immature Granulocytes 0.01 0.00 - 0.07 K/uL    Comment: Performed at [facility], [address] [place], [place] [address]  Comprehensive metabolic panel     Status: Abnormal   Collection Time: 08/26/24  2:34 AM  Result Value Ref Range   Sodium 138 135 - 145 mmol/L   Potassium 3.7 3.5 - 5.1 mmol/L   Chloride 104 98 - 111 mmol/L   CO2 23 22 - 32 mmol/L   Glucose, Bld 115 (H) 70 - 99 mg/dL    Comment: Glucose reference range applies only to samples taken after fasting for at least 8 hours.   BUN 12 6 - 20 mg/dL   Creatinine, Ser 9.10 0.44 - 1.00 mg/dL   Calcium 9.1 8.9 - 89.6 mg/dL   Total Protein 6.6 6.5 - 8.1 g/dL   Albumin 4.6 3.5 - 5.0 g/dL   AST 22 15 - 41 U/L   ALT 26 0 - 44 U/L   Alkaline Phosphatase 71 38 - 126 U/L   Total Bilirubin 0.3 0.0 - 1.2 mg/dL   GFR, Estimated >39 >39 mL/min    Comment: (NOTE) Calculated using the CKD-EPI Creatinine Equation (2021)    Anion gap 12 5 - 15    Comment: Performed at [facility], [address] [place], [place] [address]  Lipase, blood     Status: None   Collection Time: 08/26/24  2:34 AM  Result Value Ref Range   Lipase 25 11 - 51 U/L    Comment: Performed at [facility], [address] [place], [place] [address]  Troponin T, High Sensitivity     Status: None   Collection Time: 08/26/24  2:34 AM  Result Value Ref Range   Troponin T High Sensitivity <15 0 - 19 ng/L    Comment: (NOTE) Biotin concentrations > 1000 ng/mL falsely decrease TnT results.  Serial cardiac troponin measurements are suggested.  Refer to the Links section for chest pain algorithms and additional  guidance. Performed at [facility], [address] [place], [place] [address]   D-dimer, quantitative     Status: None   Collection Time: 08/26/24  2:34 AM  Result Value Ref Range   D-Dimer, Quant 0.37 0.00 - 0.50  ug/mL-FEU    Comment: (NOTE) At the manufacturer cut-off value of 0.5  g/mL FEU, this assay has a negative predictive value of 95-100%.This assay is intended for use in conjunction with a clinical pretest probability (PTP) assessment model to exclude pulmonary embolism (PE) and deep venous thrombosis (DVT) in outpatients suspected of PE or DVT. Results should be correlated with clinical presentation. Performed at [facility], [address] [place], [place] [address]   Troponin T, High Sensitivity     Status: None   Collection Time: 08/26/24  4:43 AM  Result Value Ref Range   Troponin T High Sensitivity <15 0 - 19 ng/L    Comment: (NOTE) Biotin concentrations > 1000 ng/mL falsely decrease TnT results.  Serial cardiac troponin measurements are suggested.  Refer to the Links section for chest pain algorithms and additional  guidance. Performed at [facility], [address] [place], [place] [address]    CT Angio Chest/Abd/Pel for Dissection W and/or Wo Contrast Result Date: 08/26/2024 EXAM: CTA CHEST, ABDOMEN AND PELVIS WITHOUT AND WITH CONTRAST 08/26/2024 03:43:57 AM TECHNIQUE: CTA of the chest was performed without and with the administration of intravenous contrast. CTA of the abdomen and pelvis was performed without and with the administration of intravenous contrast. 100 mL of iohexol  (OMNIPAQUE ) 350 MG/ML injection was administered intravenously. Multiplanar reformatted images are provided for review. MIP images are provided for review. Automated exposure control, iterative reconstruction, and/or weight based adjustment of the mA/kV was utilized to reduce the radiation dose to as low as reasonably achievable. COMPARISON: None available. CLINICAL HISTORY: Acute aortic syndrome (AAS) suspected. Epigastric abdominal pain. History of gallbladder attack, appendectomy, abdominoplasty, and cesarean section. FINDINGS: VASCULATURE: AORTA: No evidence of thoracic or  abdominal aortic aneurysm or dissection. PULMONARY ARTERIES: No evidence of pulmonary embolism. GREAT VESSELS OF AORTIC ARCH: No acute finding. No dissection. No arterial occlusion or significant stenosis. CELIAC TRUNK: No acute finding. No occlusion or significant stenosis. SUPERIOR MESENTERIC ARTERY: No acute finding. No occlusion or significant stenosis. INFERIOR MESENTERIC ARTERY: No acute finding. No occlusion or significant stenosis. RENAL ARTERIES: No acute finding. No occlusion or significant stenosis. ILIAC ARTERIES: No acute finding. No occlusion or significant stenosis. CHEST: MEDIASTINUM: No mediastinal lymphadenopathy. The heart and pericardium demonstrate no acute abnormality. LUNGS AND PLEURA: Dependent atelectasis in the bilateral lower lobes. No focal consolidation or pulmonary edema. No evidence of pleural effusion or pneumothorax. THORACIC BONES AND SOFT TISSUES: No acute bone or soft tissue abnormality. ABDOMEN AND PELVIS: LIVER: The liver is unremarkable. GALLBLADDER AND BILE DUCTS: Multiple gallstones, without associated inflammatory changes. No biliary ductal dilatation. SPLEEN: The spleen is unremarkable. PANCREAS: The pancreas is unremarkable. ADRENAL GLANDS: Bilateral adrenal glands demonstrate no acute abnormality. KIDNEYS, URETERS AND BLADDER: No stones in the kidneys or ureters. No hydronephrosis. No perinephric or periureteral stranding. Urinary bladder is unremarkable. GI AND BOWEL: Prior appendectomy. Scattered colonic diverticulosis, without evidence of diverticulitis. Stomach and duodenal sweep demonstrate no acute abnormality. There is no bowel obstruction. No abnormal bowel wall thickening or distension. REPRODUCTIVE: Retroverted uterus with suspected uterine fibroid. Bilateral ovaries are within normal limits. PERITONEUM AND RETROPERITONEUM: No ascites or free air. LYMPH NODES: No lymphadenopathy. ABDOMINAL BONES AND SOFT TISSUES: History of abdominoplasty and cesarean section.  No acute abnormality of the bones. No acute soft tissue abnormality. IMPRESSION: 1. No evidence of thoracoabdominal aortic aneurysm or dissection. 2. No evidence of pulmonary embolism. 3. Multiple gallstones, without associated inflammatory changes. Electronically signed by: Pinkie Pebbles MD 08/26/2024 03:54 AM EDT RP Workstation: [id number]  Assessment/Plan Biliary colic, early cholecystitis The patient has been seen, examined, labs, vitals, chart, and imaging reviewed.  She does have gallstones and her story is consistent with biliary colic.  We will plan to proceed with lap chole today for further management.  I have explained the procedure, risks, and aftercare of cholecystectomy.  Risks include but are not limited to bleeding, infection, wound problems, anesthesia, diarrhea, bile leak, injury to common bile duct/liver/intestine.  She seems to understand and agrees to proceed.  All questions at the time have been asked and answered by her and her husband, who is at bedside.  We discussed post op expectations as well.    FEN - NPO/IVFs VTE - Lovenox  tomorrow, SCDs in OR ID - Rocephin  on call Admit - obs, OR today  Anxiety - home meds restarted Tobacco abuse  I reviewed ED provider notes, last 24 h vitals and pain scores, last 24 h labs and trends, and last 24 h imaging results.  Burnard FORBES Banter, [facility] 08/26/2024, 10:52 AM Please see Amion for pager number during day hours 7:00am-4:30pm or 7:00am -11:30am on weekends

## 2024-08-26 NOTE — ED Triage Notes (Signed)
 Upper mid abdominal pain. Hx of Gallbladder attack, is similar but not same. Started last night.

## 2024-08-26 NOTE — Plan of Care (Signed)
   Problem: Clinical Measurements: Goal: Diagnostic test results will improve Outcome: Progressing   Problem: Activity: Goal: Risk for activity intolerance will decrease Outcome: Progressing   Problem: Nutrition: Goal: Adequate nutrition will be maintained Outcome: Progressing

## 2024-08-26 NOTE — Discharge Instructions (Signed)

## 2024-08-26 NOTE — ED Provider Notes (Signed)
 [place] [facility] Provider Note   CSN: [id number] Arrival date & time: 08/26/24  0211     Patient presents with: Abdominal Pain   Maria Bell is a 53 y.o. female.   Patient with a history of hormone replacements, hypertension, gallstones, anxiety presents with upper abdominal pain and mid back pain.  Symptoms started about 8 or 9 PM last night after eating.  Was able to go to sleep but woke up with pain more severe than when she went to bed.  Nausea but no vomiting.  Feels hot but has not checked her temperature.  Denies chest pain but has some pain to her mid back.  No shortness of breath or cough.  No pain with urination or blood in the urine.  Previous appendectomy.  Was told she had gallstones but still has her gallbladder and not seeing a Careers adviser.  Her last attack was about 3 years ago. She feels this is similar to previous gallstones but more severe.  The history is provided by the patient and the spouse.  Abdominal Pain Associated symptoms: nausea   Associated symptoms: no chest pain, no cough, no dysuria, no fever, no hematuria, no shortness of breath and no vomiting        Prior to Admission medications   Medication Sig Start Date End Date Taking? Authorizing Provider  ALPRAZolam  (XANAX ) 0.5 MG tablet Take 0.5 mg by mouth 3 (three) times daily. 01/25/22   [provider]  CALCIUM PO Take 2 tablets by mouth daily.    [provider]  hydrochlorothiazide (HYDRODIURIL) 50 MG tablet Take 50 mg by mouth daily. 03/01/22   [provider]  metoCLOPramide  (REGLAN ) 10 MG tablet Take 1 tablet (10 mg total) by mouth every 6 (six) hours for 10 doses. 03/05/22 03/08/22  Phebe Fonda RAMAN, MD  NON FORMULARY Take 1 capsule by mouth daily. diindolylmethane    [provider]  progesterone (PROMETRIUM) 200 MG capsule Take 200 mg by mouth at bedtime. 02/24/22   [provider]  Red Yeast Rice Extract (RED YEAST RICE  PO) Take 2 capsules by mouth daily.    [provider]  VITAMIN D PO Take 1 drop by mouth daily.    [provider]    Allergies: Ketorolac     Review of Systems  Constitutional:  Positive for activity change and appetite change. Negative for fever.  HENT:  Negative for congestion and rhinorrhea.   Respiratory:  Negative for cough, chest tightness and shortness of breath.   Cardiovascular:  Negative for chest pain.  Gastrointestinal:  Positive for abdominal pain and nausea. Negative for vomiting.  Genitourinary:  Negative for dysuria and hematuria.  Musculoskeletal:  Positive for back pain. Negative for arthralgias and myalgias.  Skin:  Negative for rash.  Neurological:  Negative for dizziness, weakness and headaches.   all other systems are negative except as noted in the HPI and PMH.    Updated Vital Signs BP (!) 121/90 (BP Location: Left Arm)   Pulse 82   Temp 97.7 F (36.5 C) (Oral)   Resp 20   Ht 5' 4 (1.626 m)   Wt 73 kg   SpO2 98%   BMI 27.64 kg/m   Physical Exam Vitals and nursing note reviewed.  Constitutional:      General: She is not in acute distress.    Appearance: She is well-developed.     Comments: Uncomfortable  HENT:     Head: Normocephalic  and atraumatic.     Mouth/Throat:     Pharynx: No oropharyngeal exudate.  Eyes:     Conjunctiva/sclera: Conjunctivae normal.     Pupils: Pupils are equal, round, and reactive to light.  Neck:     Comments: No meningismus. Cardiovascular:     Rate and Rhythm: Normal rate and regular rhythm.     Heart sounds: Normal heart sounds. No murmur heard. Pulmonary:     Effort: Pulmonary effort is normal. No respiratory distress.     Breath sounds: Normal breath sounds.  Abdominal:     Palpations: Abdomen is soft.     Tenderness: There is abdominal tenderness. There is guarding. There is no rebound.     Comments: Epigastric tenderness with right upper quadrant tenderness, voluntary guarding.  No  rebound.  Musculoskeletal:        General: Tenderness present. Normal range of motion.     Cervical back: Normal range of motion and neck supple.     Comments: Right paraspinal CVA tenderness Intact DP and PT pulses bilaterally  Skin:    General: Skin is warm.  Neurological:     Mental Status: She is alert and oriented to person, place, and time.     Cranial Nerves: No cranial nerve deficit.     Motor: No abnormal muscle tone.     Coordination: Coordination normal.     Comments:  5/5 strength throughout. CN 2-12 intact.Equal grip strength.   Psychiatric:        Behavior: Behavior normal.     (all labs ordered are listed, but only abnormal results are displayed) Labs Reviewed  CBC WITH DIFFERENTIAL/PLATELET - Abnormal; Notable for the following components:      Result Value   MCHC 36.4 (*)    All other components within normal limits  COMPREHENSIVE METABOLIC PANEL WITH GFR - Abnormal; Notable for the following components:   Glucose, Bld 115 (*)    All other components within normal limits  LIPASE, BLOOD  D-DIMER, QUANTITATIVE  TROPONIN T, HIGH SENSITIVITY  TROPONIN T, HIGH SENSITIVITY    EKG: EKG Interpretation Date/Time:  Wednesday August 26 2024 02:34:29 EDT Ventricular Rate:  68 PR Interval:  165 QRS Duration:  83 QT Interval:  412 QTC Calculation: 439 R Axis:   65  Text Interpretation: Sinus rhythm No significant change was found Confirmed by Carita Senior [phone]) on 08/26/2024 2:47:29 AM  Radiology: CT Angio Chest/Abd/Pel for Dissection W and/or Wo Contrast Result Date: 08/26/2024 EXAM: CTA CHEST, ABDOMEN AND PELVIS WITHOUT AND WITH CONTRAST 08/26/2024 03:43:57 AM TECHNIQUE: CTA of the chest was performed without and with the administration of intravenous contrast. CTA of the abdomen and pelvis was performed without and with the administration of intravenous contrast. 100 mL of iohexol  (OMNIPAQUE ) 350 MG/ML injection was administered intravenously. Multiplanar  reformatted images are provided for review. MIP images are provided for review. Automated exposure control, iterative reconstruction, and/or weight based adjustment of the mA/kV was utilized to reduce the radiation dose to as low as reasonably achievable. COMPARISON: None available. CLINICAL HISTORY: Acute aortic syndrome (AAS) suspected. Epigastric abdominal pain. History of gallbladder attack, appendectomy, abdominoplasty, and cesarean section. FINDINGS: VASCULATURE: AORTA: No evidence of thoracic or abdominal aortic aneurysm or dissection. PULMONARY ARTERIES: No evidence of pulmonary embolism. GREAT VESSELS OF AORTIC ARCH: No acute finding. No dissection. No arterial occlusion or significant stenosis. CELIAC TRUNK: No acute finding. No occlusion or significant stenosis. SUPERIOR MESENTERIC ARTERY: No acute finding. No occlusion or significant stenosis. INFERIOR MESENTERIC  ARTERY: No acute finding. No occlusion or significant stenosis. RENAL ARTERIES: No acute finding. No occlusion or significant stenosis. ILIAC ARTERIES: No acute finding. No occlusion or significant stenosis. CHEST: MEDIASTINUM: No mediastinal lymphadenopathy. The heart and pericardium demonstrate no acute abnormality. LUNGS AND PLEURA: Dependent atelectasis in the bilateral lower lobes. No focal consolidation or pulmonary edema. No evidence of pleural effusion or pneumothorax. THORACIC BONES AND SOFT TISSUES: No acute bone or soft tissue abnormality. ABDOMEN AND PELVIS: LIVER: The liver is unremarkable. GALLBLADDER AND BILE DUCTS: Multiple gallstones, without associated inflammatory changes. No biliary ductal dilatation. SPLEEN: The spleen is unremarkable. PANCREAS: The pancreas is unremarkable. ADRENAL GLANDS: Bilateral adrenal glands demonstrate no acute abnormality. KIDNEYS, URETERS AND BLADDER: No stones in the kidneys or ureters. No hydronephrosis. No perinephric or periureteral stranding. Urinary bladder is unremarkable. GI AND BOWEL:  Prior appendectomy. Scattered colonic diverticulosis, without evidence of diverticulitis. Stomach and duodenal sweep demonstrate no acute abnormality. There is no bowel obstruction. No abnormal bowel wall thickening or distension. REPRODUCTIVE: Retroverted uterus with suspected uterine fibroid. Bilateral ovaries are within normal limits. PERITONEUM AND RETROPERITONEUM: No ascites or free air. LYMPH NODES: No lymphadenopathy. ABDOMINAL BONES AND SOFT TISSUES: History of abdominoplasty and cesarean section. No acute abnormality of the bones. No acute soft tissue abnormality. IMPRESSION: 1. No evidence of thoracoabdominal aortic aneurysm or dissection. 2. No evidence of pulmonary embolism. 3. Multiple gallstones, without associated inflammatory changes. Electronically signed by: Pinkie Pebbles MD 08/26/2024 03:54 AM EDT RP Workstation: [id number]     Procedures   Medications Ordered in the ED  fentaNYL  (SUBLIMAZE ) injection 50 mcg (has no administration in time range)  ondansetron  (ZOFRAN ) injection 4 mg (has no administration in time range)                                    Medical Decision Making Amount and/or Complexity of Data Reviewed Labs: ordered. Decision-making details documented in ED Course. Radiology: ordered and independent interpretation performed. Decision-making details documented in ED Course. ECG/medicine tests: ordered and independent interpretation performed. Decision-making details documented in ED Course.  Risk Prescription drug management. Decision regarding hospitalization.   Upper abdominal pain since 8 PM similar to previous biliary colic.  Stable vitals but very uncomfortable.  Will obtain EKG, labs.  Ultrasound in 2021 showed multiple gallstones as large as 2 cm.  IV access established.  Patient given pain and nausea medications.  Labs obtained showed normal kidney function, pancreas function and liver function.  No significant leukocytosis.  Suspect likely  gallstones.  However she is very uncomfortable and writhing around in the bed therefore we will proceed with CT angiogram to assess for aortic dissection or other vascular pathology. EKG is sinus rhythm with low concern for ACS.  Troponin is negative.  CTA negative for aortic aneurysm or dissection.  Negative for pulmonary embolism.  Patient still with severe pain requiring multiple doses of pain medication.  Discussed with her that biliary colic seems to be her issue and she deserves to see a surgeon to have her gallbladder removed.  She is asking if this can be done today. Troponin negative x 2.  Will discuss with surgery to see if this is possible.  Discussed with Dr. Vernetta.  He is not certain if there will be OR availability today but depends on the schedule.  He will place admission orders.  Discussed with patient that surgery may not be possible today but  she reports she is still too uncomfortable to go home.  Discussed with patient.  She would prefer to have her gallbladder removed today if possible.  She understands it may not be possible today.  Plan admission to [facility] for pain control.  NPO.  Discussed with Dr. Vernetta.    Final diagnoses:  Symptomatic cholelithiasis    ED Discharge Orders     None          Imer Foxworth, [place], MD 08/26/24 [phone]

## 2024-08-27 ENCOUNTER — Encounter (HOSPITAL_COMMUNITY): Payer: Self-pay | Admitting: Surgery

## 2024-08-27 ENCOUNTER — Observation Stay (HOSPITAL_COMMUNITY): Admitting: Certified Registered Nurse Anesthetist

## 2024-08-27 ENCOUNTER — Observation Stay (HOSPITAL_BASED_OUTPATIENT_CLINIC_OR_DEPARTMENT_OTHER): Admitting: Certified Registered Nurse Anesthetist

## 2024-08-27 ENCOUNTER — Encounter (HOSPITAL_COMMUNITY)
Admission: EM | Disposition: A | Discharge status: Home / Self Care | Payer: Self-pay | Source: Home / Self Care | Attending: Emergency Medicine

## 2024-08-27 DIAGNOSIS — K8020 Calculus of gallbladder without cholecystitis without obstruction: Secondary | ICD-10-CM

## 2024-08-27 HISTORY — PX: CHOLECYSTECTOMY: SHX55

## 2024-08-27 SURGERY — LAPAROSCOPIC CHOLECYSTECTOMY
Anesthesia: General

## 2024-08-27 MED ORDER — MIDAZOLAM HCL 2 MG/2ML IJ SOLN
INTRAMUSCULAR | Status: DC | PRN
  Administered 2024-08-27: 2 mg via INTRAVENOUS

## 2024-08-27 MED ORDER — ONDANSETRON HCL 4 MG/2ML IJ SOLN
INTRAMUSCULAR | Status: DC | PRN
Start: 2024-08-27 — End: 2024-08-27
  Administered 2024-08-27: 4 mg via INTRAVENOUS

## 2024-08-27 MED ORDER — PROCHLORPERAZINE EDISYLATE 10 MG/2ML IJ SOLN
10.0000 mg | Freq: Four times a day (QID) | INTRAMUSCULAR | Status: DC | PRN
  Administered 2024-08-27: 10 mg via INTRAVENOUS
  Filled 2024-08-27: qty 2

## 2024-08-27 MED ORDER — CHLORHEXIDINE GLUCONATE 0.12 % MT SOLN
15.0000 mL | Freq: Once | OROMUCOSAL | Status: AC
  Administered 2024-08-27: 15 mL via OROMUCOSAL

## 2024-08-27 MED ORDER — DEXAMETHASONE SODIUM PHOSPHATE 10 MG/ML IJ SOLN
INTRAMUSCULAR | Status: AC
  Filled 2024-08-27: qty 1

## 2024-08-27 MED ORDER — OXYCODONE HCL 5 MG/5ML PO SOLN: 5.0000 mg | Freq: Once | ORAL | Status: DC | PRN

## 2024-08-27 MED ORDER — BUPIVACAINE-EPINEPHRINE (PF) 0.25% -1:200000 IJ SOLN
INTRAMUSCULAR | Status: AC
  Filled 2024-08-27: qty 30

## 2024-08-27 MED ORDER — DEXMEDETOMIDINE HCL IN NACL 80 MCG/20ML IV SOLN
INTRAVENOUS | Status: DC | PRN
  Administered 2024-08-27: 8 ug via INTRAVENOUS

## 2024-08-27 MED ORDER — OXYCODONE HCL 5 MG PO TABS: 5.0000 mg | ORAL_TABLET | Freq: Four times a day (QID) | ORAL | 0 refills | Status: AC | PRN

## 2024-08-27 MED ORDER — PROPOFOL 10 MG/ML IV BOLUS
INTRAVENOUS | Status: AC
  Filled 2024-08-27: qty 20

## 2024-08-27 MED ORDER — SUGAMMADEX SODIUM 200 MG/2ML IV SOLN
INTRAVENOUS | Status: AC
  Filled 2024-08-27: qty 2

## 2024-08-27 MED ORDER — INDOCYANINE GREEN 25 MG IV SOLR
2.5000 mg | INTRAVENOUS | Status: AC
  Administered 2024-08-27: 2.5 mg via INTRAVENOUS
  Filled 2024-08-27: qty 10

## 2024-08-27 MED ORDER — BUPIVACAINE-EPINEPHRINE 0.25% -1:200000 IJ SOLN
INTRAMUSCULAR | Status: DC | PRN
  Administered 2024-08-27: 30 mL

## 2024-08-27 MED ORDER — FENTANYL CITRATE (PF) 100 MCG/2ML IJ SOLN
INTRAMUSCULAR | Status: AC
  Filled 2024-08-27: qty 2

## 2024-08-27 MED ORDER — FENTANYL CITRATE (PF) 100 MCG/2ML IJ SOLN
INTRAMUSCULAR | Status: DC | PRN
  Administered 2024-08-27: 100 ug via INTRAVENOUS
  Administered 2024-08-27 (×2): 50 ug via INTRAVENOUS

## 2024-08-27 MED ORDER — PROPOFOL 10 MG/ML IV BOLUS
INTRAVENOUS | Status: DC | PRN
  Administered 2024-08-27: 150 mg via INTRAVENOUS

## 2024-08-27 MED ORDER — SODIUM CHLORIDE 0.9 % IV SOLN
2.0000 g | Freq: Once | INTRAVENOUS | Status: AC
  Administered 2024-08-27: 2 g via INTRAVENOUS

## 2024-08-27 MED ORDER — MIDAZOLAM HCL 2 MG/2ML IJ SOLN: 0.5000 mg | Freq: Once | INTRAMUSCULAR | Status: DC | PRN

## 2024-08-27 MED ORDER — ROCURONIUM BROMIDE 10 MG/ML (PF) SYRINGE
PREFILLED_SYRINGE | INTRAVENOUS | Status: DC | PRN
  Administered 2024-08-27: 60 mg via INTRAVENOUS

## 2024-08-27 MED ORDER — DEXMEDETOMIDINE HCL IN NACL 80 MCG/20ML IV SOLN
INTRAVENOUS | Status: AC
Start: 2024-08-27 — End: 2024-08-27
  Filled 2024-08-27: qty 20

## 2024-08-27 MED ORDER — SODIUM CHLORIDE 0.9 % IV SOLN
INTRAVENOUS | Status: AC
  Filled 2024-08-27: qty 20

## 2024-08-27 MED ORDER — MEPERIDINE HCL 100 MG/ML IJ SOLN: 6.2500 mg | INTRAMUSCULAR | Status: DC | PRN

## 2024-08-27 MED ORDER — 0.9 % SODIUM CHLORIDE (POUR BTL) OPTIME
TOPICAL | Status: DC | PRN
  Administered 2024-08-27: 1000 mL

## 2024-08-27 MED ORDER — ONDANSETRON HCL 4 MG/2ML IJ SOLN
INTRAMUSCULAR | Status: AC
  Filled 2024-08-27: qty 2

## 2024-08-27 MED ORDER — LIDOCAINE HCL (CARDIAC) PF 100 MG/5ML IV SOSY
PREFILLED_SYRINGE | INTRAVENOUS | Status: DC | PRN
  Administered 2024-08-27: 60 mg via INTRAVENOUS

## 2024-08-27 MED ORDER — MIDAZOLAM HCL 2 MG/2ML IJ SOLN
INTRAMUSCULAR | Status: AC
  Filled 2024-08-27: qty 2

## 2024-08-27 MED ORDER — HYDROMORPHONE HCL 1 MG/ML IJ SOLN
INTRAMUSCULAR | Status: AC
  Filled 2024-08-27: qty 1

## 2024-08-27 MED ORDER — HYDROMORPHONE HCL 1 MG/ML IJ SOLN
0.2500 mg | INTRAMUSCULAR | Status: DC | PRN
  Administered 2024-08-27 (×2): 0.5 mg via INTRAVENOUS

## 2024-08-27 MED ORDER — EPHEDRINE SULFATE-NACL 50-0.9 MG/10ML-% IV SOSY
PREFILLED_SYRINGE | INTRAVENOUS | Status: DC | PRN
  Administered 2024-08-27 (×2): 5 mg via INTRAVENOUS

## 2024-08-27 MED ORDER — LACTATED RINGERS IR SOLN
Status: DC | PRN
  Administered 2024-08-27: 1000 mL

## 2024-08-27 MED ORDER — SUGAMMADEX SODIUM 200 MG/2ML IV SOLN
INTRAVENOUS | Status: DC | PRN
  Administered 2024-08-27: 150 mg via INTRAVENOUS

## 2024-08-27 MED ORDER — DEXAMETHASONE SODIUM PHOSPHATE 10 MG/ML IJ SOLN
INTRAMUSCULAR | Status: DC | PRN
  Administered 2024-08-27: 4 mg via INTRAVENOUS

## 2024-08-27 MED ORDER — ROCURONIUM BROMIDE 10 MG/ML (PF) SYRINGE
PREFILLED_SYRINGE | INTRAVENOUS | Status: AC
  Filled 2024-08-27: qty 10

## 2024-08-27 MED ORDER — LIDOCAINE HCL (PF) 2 % IJ SOLN
INTRAMUSCULAR | Status: AC
Start: 2024-08-27 — End: 2024-08-27
  Filled 2024-08-27: qty 5

## 2024-08-27 MED ORDER — LACTATED RINGERS IV SOLN: INTRAVENOUS | Status: DC

## 2024-08-27 MED ORDER — OXYCODONE HCL 5 MG PO TABS: 5.0000 mg | ORAL_TABLET | Freq: Once | ORAL | Status: DC | PRN

## 2024-08-27 SURGICAL SUPPLY — 35 items
BAG COUNTER SPONGE SURGICOUNT (BAG) IMPLANT
CHLORAPREP W/TINT 26 (MISCELLANEOUS) ×1 IMPLANT
CLIP APPLIE 5 13 M/L LIGAMAX5 (MISCELLANEOUS) ×1 IMPLANT
COVER MAYO STAND XLG (MISCELLANEOUS) IMPLANT
COVER SURGICAL LIGHT HANDLE (MISCELLANEOUS) ×1 IMPLANT
DERMABOND ADVANCED .7 DNX12 (GAUZE/BANDAGES/DRESSINGS) ×1 IMPLANT
DRAPE C-ARM 42X120 X-RAY (DRAPES) IMPLANT
ELECT PENCIL ROCKER SW 15FT (MISCELLANEOUS) ×1 IMPLANT
ELECT REM PT RETURN 15FT ADLT (MISCELLANEOUS) ×1 IMPLANT
ELECTRODE L-HOOK LAP 45CM DISP (ELECTROSURGICAL) IMPLANT
GLOVE BIOGEL PI IND STRL 6 (GLOVE) ×1 IMPLANT
GLOVE BIOGEL PI MICRO STRL 5.5 (GLOVE) ×1 IMPLANT
GOWN STRL REUS W/ TWL LRG LVL3 (GOWN DISPOSABLE) ×1 IMPLANT
GRASPER SUT TROCAR 14GX15 (MISCELLANEOUS) ×1 IMPLANT
HEMOSTAT SNOW SURGICEL 2X4 (HEMOSTASIS) IMPLANT
IRRIGATION SUCT STRKRFLW 2 WTP (MISCELLANEOUS) ×1 IMPLANT
KIT BASIN OR (CUSTOM PROCEDURE TRAY) ×1 IMPLANT
KIT TURNOVER KIT A (KITS) ×1 IMPLANT
LHOOK LAP DISP 36CM (ELECTROSURGICAL) ×1 IMPLANT
NDL INSUFFLATION 14GA 120MM (NEEDLE) ×1 IMPLANT
NEEDLE INSUFFLATION 14GA 120MM (NEEDLE) ×1 IMPLANT
POUCH RETRIEVAL ECOSAC 10 (ENDOMECHANICALS) IMPLANT
SCISSORS LAP 5X35 DISP (ENDOMECHANICALS) ×1 IMPLANT
SET CHOLANGIOGRAPH MIX (MISCELLANEOUS) IMPLANT
SET TUBE SMOKE EVAC HIGH FLOW (TUBING) ×1 IMPLANT
SLEEVE Z-THREAD 5X100MM (TROCAR) ×2 IMPLANT
SPIKE FLUID TRANSFER (MISCELLANEOUS) ×1 IMPLANT
SUT MNCRL AB 4-0 PS2 18 (SUTURE) ×1 IMPLANT
SUT VICRYL 0 UR6 27IN ABS (SUTURE) ×1 IMPLANT
SYSTEM BAG RETRIEVAL 10MM (BASKET) IMPLANT
TOWEL OR 17X26 10 PK STRL BLUE (TOWEL DISPOSABLE) ×1 IMPLANT
TRAY LAPAROSCOPIC (CUSTOM PROCEDURE TRAY) ×1 IMPLANT
TROCAR ADV FIXATION 12X100MM (TROCAR) ×1 IMPLANT
TROCAR BALLN 12MMX100 BLUNT (TROCAR) IMPLANT
TROCAR Z-THREAD OPTICAL 5X100M (TROCAR) ×1 IMPLANT

## 2024-08-27 NOTE — Plan of Care (Signed)

## 2024-08-27 NOTE — Anesthesia Postprocedure Evaluation (Signed)
 Anesthesia Post Note  Patient: Maria Bell  Procedure(s) Performed: LAPAROSCOPIC CHOLECYSTECTOMY     Patient location during evaluation: PACU Anesthesia Type: General Level of consciousness: awake and alert Pain management: pain level controlled Vital Signs Assessment: post-procedure vital signs reviewed and stable Respiratory status: spontaneous breathing, nonlabored ventilation and respiratory function stable Cardiovascular status: blood pressure returned to baseline and stable Postop Assessment: no apparent nausea or vomiting Anesthetic complications: no   No notable events documented.                  Pang Robers

## 2024-08-27 NOTE — Plan of Care (Signed)
  Problem: Clinical Measurements: Goal: Ability to maintain clinical measurements within normal limits will improve Outcome: Progressing Goal: Diagnostic test results will improve Outcome: Progressing   

## 2024-08-27 NOTE — Anesthesia Procedure Notes (Addendum)
 Procedure Name: Intubation Date/Time: 08/27/2024 9:03 AM  Performed by: Metta Andrea NOVAK, CRNAPre-anesthesia Checklist: Patient identified, Emergency Drugs available, Suction available, Patient being monitored and Timeout performed Patient Re-evaluated:Patient Re-evaluated prior to induction Oxygen Delivery Method: Circle system utilized Preoxygenation: Pre-oxygenation with 100% oxygen Induction Type: IV induction Ventilation: Mask ventilation without difficulty Laryngoscope Size: Mac and 4 Grade View: Grade I Tube type: Oral Tube size: 7.0 mm Number of attempts: 1 Airway Equipment and Method: Stylet Placement Confirmation: ETT inserted through vocal cords under direct vision, positive ETCO2 and breath sounds checked- equal and bilateral Secured at: 21 cm Tube secured with: Tape Dental Injury: Teeth and Oropharynx as per pre-operative assessment

## 2024-08-27 NOTE — Transfer of Care (Signed)
 Immediate Anesthesia Transfer of Care Note  Patient: Maria Bell [facility]  Procedure(s) Performed: LAPAROSCOPIC CHOLECYSTECTOMY  Patient Location: PACU  Anesthesia Type:General  Level of Consciousness: awake, alert , and patient cooperative  Airway & Oxygen Therapy: Patient Spontanous Breathing and Patient connected to face mask oxygen  Post-op Assessment: Report given to RN and Post -op Vital signs reviewed and stable  Post vital signs: Reviewed and stable  Last Vitals:  Vitals Value Taken Time  BP 142/90 08/27/24 10:04  Temp    Pulse 99 08/27/24 10:05  Resp 17 08/27/24 10:05  SpO2 100 % 08/27/24 10:05  Vitals shown include unfiled device data.  Last Pain:  Vitals:   08/27/24 0804  TempSrc: Oral  PainSc:       Patients Stated Pain Goal: 5 (08/26/24 1226)  Complications: No notable events documented.

## 2024-08-27 NOTE — Progress Notes (Signed)
 * Day of Surgery *  Subjective: Minimal pain overnight. Vitals stable.  Objective: Vital signs in last 24 hours: Temp:  [97.5 F (36.4 C)-98.1 F (36.7 C)] 97.6 F (36.4 C) (09/25 0804) Pulse Rate:  [50-61] 53 (09/25 0804) Resp:  [16-18] 16 (09/25 0804) BP: (110-127)/(75-82) 123/82 (09/25 0804) SpO2:  [96 %-99 %] 96 % (09/25 0804) Weight:  [73 kg] 73 kg (09/25 0803) Last BM Date : 08/25/24  Intake/Output from previous day: 09/24 0701 - 09/25 0700 In: 2598.5 [P.O.:650; I.V.:1948.5] Out: -  Intake/Output this shift: No intake/output data recorded.  PE: General: resting comfortably, NAD Neuro: alert and oriented, no focal deficits Resp: normal work of breathing on room air Abdomen: soft, nondistended, nontender to palpation.  Extremities: warm and well-perfused   Lab Results:  Recent Labs    08/26/24 0234  WBC 5.8  HGB 14.1  HCT 38.7  PLT 181   BMET Recent Labs    08/26/24 0234  NA 138  K 3.7  CL 104  CO2 23  GLUCOSE 115*  BUN 12  CREATININE 0.89  CALCIUM 9.1   PT/INR No results for input(s): LABPROT, INR in the last 72 hours. CMP     Component Value Date/Time   NA 138 08/26/2024 0234   K 3.7 08/26/2024 0234   CL 104 08/26/2024 0234   CO2 23 08/26/2024 0234   GLUCOSE 115 (H) 08/26/2024 0234   BUN 12 08/26/2024 0234   CREATININE 0.89 08/26/2024 0234   CALCIUM 9.1 08/26/2024 0234   PROT 6.6 08/26/2024 0234   ALBUMIN 4.6 08/26/2024 0234   AST 22 08/26/2024 0234   ALT 26 08/26/2024 0234   ALKPHOS 71 08/26/2024 0234   BILITOT 0.3 08/26/2024 0234   GFRNONAA >60 08/26/2024 0234   GFRAA >60 02/28/2020 0640   Lipase     Component Value Date/Time   LIPASE 25 08/26/2024 0234       Studies/Results: CT Angio Chest/Abd/Pel for Dissection W and/or Wo Contrast Result Date: 08/26/2024 EXAM: CTA CHEST, ABDOMEN AND PELVIS WITHOUT AND WITH CONTRAST 08/26/2024 03:43:57 AM TECHNIQUE: CTA of the chest was performed without and with the  administration of intravenous contrast. CTA of the abdomen and pelvis was performed without and with the administration of intravenous contrast. 100 mL of iohexol  (OMNIPAQUE ) 350 MG/ML injection was administered intravenously. Multiplanar reformatted images are provided for review. MIP images are provided for review. Automated exposure control, iterative reconstruction, and/or weight based adjustment of the mA/kV was utilized to reduce the radiation dose to as low as reasonably achievable. COMPARISON: None available. CLINICAL HISTORY: Acute aortic syndrome (AAS) suspected. Epigastric abdominal pain. History of gallbladder attack, appendectomy, abdominoplasty, and cesarean section. FINDINGS: VASCULATURE: AORTA: No evidence of thoracic or abdominal aortic aneurysm or dissection. PULMONARY ARTERIES: No evidence of pulmonary embolism. GREAT VESSELS OF AORTIC ARCH: No acute finding. No dissection. No arterial occlusion or significant stenosis. CELIAC TRUNK: No acute finding. No occlusion or significant stenosis. SUPERIOR MESENTERIC ARTERY: No acute finding. No occlusion or significant stenosis. INFERIOR MESENTERIC ARTERY: No acute finding. No occlusion or significant stenosis. RENAL ARTERIES: No acute finding. No occlusion or significant stenosis. ILIAC ARTERIES: No acute finding. No occlusion or significant stenosis. CHEST: MEDIASTINUM: No mediastinal lymphadenopathy. The heart and pericardium demonstrate no acute abnormality. LUNGS AND PLEURA: Dependent atelectasis in the bilateral lower lobes. No focal consolidation or pulmonary edema. No evidence of pleural effusion or pneumothorax. THORACIC BONES AND SOFT TISSUES: No acute bone or soft tissue abnormality. ABDOMEN AND PELVIS:  LIVER: The liver is unremarkable. GALLBLADDER AND BILE DUCTS: Multiple gallstones, without associated inflammatory changes. No biliary ductal dilatation. SPLEEN: The spleen is unremarkable. PANCREAS: The pancreas is unremarkable. ADRENAL GLANDS:  Bilateral adrenal glands demonstrate no acute abnormality. KIDNEYS, URETERS AND BLADDER: No stones in the kidneys or ureters. No hydronephrosis. No perinephric or periureteral stranding. Urinary bladder is unremarkable. GI AND BOWEL: Prior appendectomy. Scattered colonic diverticulosis, without evidence of diverticulitis. Stomach and duodenal sweep demonstrate no acute abnormality. There is no bowel obstruction. No abnormal bowel wall thickening or distension. REPRODUCTIVE: Retroverted uterus with suspected uterine fibroid. Bilateral ovaries are within normal limits. PERITONEUM AND RETROPERITONEUM: No ascites or free air. LYMPH NODES: No lymphadenopathy. ABDOMINAL BONES AND SOFT TISSUES: History of abdominoplasty and cesarean section. No acute abnormality of the bones. No acute soft tissue abnormality. IMPRESSION: 1. No evidence of thoracoabdominal aortic aneurysm or dissection. 2. No evidence of pulmonary embolism. 3. Multiple gallstones, without associated inflammatory changes. Electronically signed by: Pinkie Pebbles MD 08/26/2024 03:54 AM EDT RP Workstation: [id number]    Anti-infectives: Anti-infectives (From admission, onward)    Start     Dose/Rate Route Frequency Ordered Stop   08/26/24 1300  cefTRIAXone  (ROCEPHIN ) 2 g in sodium chloride  0.9 % 100 mL IVPB        2 g 200 mL/hr over 30 Minutes Intravenous On call to O.R. 08/26/24 1100 08/27/24 0559        Assessment/Plan 53 yo female with symptomatic cholelithiasis. Proceed to OR for laparoscopic cholecystectomy. The procedure details were reviewed and informed consent obtained. Will likely discharge home today following surgery. All questions were answered.     LOS: 0 days    Leonor Dawn, MD [facility] General, Hepatobiliary and Pancreatic Surgery 08/27/24 8:38 AM

## 2024-08-27 NOTE — Op Note (Signed)
 Date: 08/27/24  Patient: Maria Bell MRN: [id number]  Preoperative Diagnosis: Symptomatic cholelithiasis Postoperative Diagnosis: Same  Procedure: Laparoscopic cholecystectomy with fluorescence ICG cholangiography  Surgeon: Leonor Dawn, MD  EBL: Minimal  Anesthesia: General endotracheal  Specimens: Gallbladder  Indications: [name] is a 53 yo female who presented with severe epigastric abdominal pain radiating to the RUQ. CT scan showed multiple large gallstones. She was admitted and after a discussion of the risks and benefits of surgery, consented to proceed with cholecystectomy.  Findings: Cholelithiasis without signs of acute cholecystitis. Stones impacted in the neck of the gallbladder.  Procedure details: Informed consent was obtained in the preoperative area prior to the procedure. The patient was brought to the operating room and placed on the table in the supine position. General anesthesia was induced and appropriate lines and drains were placed for intraoperative monitoring. Perioperative antibiotics were administered per SCIP guidelines. The abdomen was prepped and draped in the usual sterile fashion. A pre-procedure timeout was taken verifying patient identity, surgical site and procedure to be performed.  A small infraumbilical skin incision was made, the umbilical stalk was grasped and elevated, and a Veress needle was inserted through the fascia. Intraperitoneal placement was confirmed with the saline drop test and the abdomen was insufflated. A 5mm Visiport was placed, and the peritoneal cavity was inspected with no evidence of visceral or vascular injury. A 12mm subxiphoid port was placed, followed by two 5mm ports in the right lateral costal margin, all under direct visualization. The fundus of the gallbladder was grasped and retracted cephalad. There were some omental adhesions to the gallbladder, which were taken down using cautery and blunt dissection. The  infundibulum was retracted laterally. The cystic triangle was dissected out using cautery and blunt dissection, sweeping the fat in the cystic triangle medially. The cystic duct and cystic artery were circumferentially dissected out. ICG was used to confirm the biliary anatomy, and the critical view of safety was obtained. There were some stones in the cystic duct, which were milked up into the gallbladder. The cystic duct and cystic artery were clipped and divided, leaving two clips behind on the cystic duct stump. The gallbladder was taken off the liver using cautery, and the specimen was placed in an Ecosac. The surgical site was irrigated with saline until the effluent was clear. Hemostasis was achieved in the gallbladder fossa using cautery. The cystic duct and artery stumps were visually inspected and there was no evidence of bile leak or bleeding. The specimen was extracted via the subxiphoid port, and the fascia at this port site was closed with a 0 Vicryl suture using a PMI. The abdomen was desufflated and the remaining ports were removed. The skin at all port sites was closed with 4-0 monocryl subcuticular suture. Dermabond was applied.    The patient tolerated the procedure well with no apparent complications. All counts were correct x2 at the end of the procedure. The patient was extubated and taken to PACU in stable condition.  Leonor Dawn, MD 08/27/24 10:07 AM

## 2024-08-28 ENCOUNTER — Encounter (HOSPITAL_COMMUNITY): Payer: Self-pay | Admitting: Surgery

## 2024-08-28 LAB — SURGICAL PATHOLOGY

## 2024-08-31 NOTE — Discharge Summary (Signed)
 Physician Discharge Summary   Patient ID: Maria Bell [id number] 53 y.o. 02-Mar-1971  Admit date: 08/26/2024  Discharge date and time: 08/27/2024  6:08 PM   Admitting Physician: Vicenta Poli, MD   Discharge Physician: Leonor Dawn, MD  Admission Diagnoses: Symptomatic cholelithiasis [K80.20]  Discharge Diagnoses: Same  Admission Condition: stable  Discharged Condition: good  Indication for Admission: [name] is a 53 yo female who presented to the ED with severe epigastric pain that radiated to the RUQ and back. Workup in the ED showed multiple gallstones. Her pain persisted, and she was admitted. Please see separately dictated H&P for further details.   Hospital Course: The patient was admitted on 9/24. She was taken to the OR on 08/27/24 for a laparoscopic cholecystectomy. Please see separately dictated op note for further details. Postoperatively she remained in stable condition and was discharged home the afternoon of POD0.   Consults: None  Significant Diagnostic Studies: CT scan: multiple gallstones without signs of cholecystitis  Treatments: analgesia: Dilaudid  and oxycodone  and surgery: Laparoscopic cholecystectomy with ICG cholangiography  Discharge Exam: General: resting comfortably, NAD Neuro: alert and oriented, no focal deficits Resp: normal work of breathing on room air Abdomen: soft, nondistended, incisions clean and dry. Extremities: warm and well-perfused   Disposition: Discharge disposition: 01-Home or Self Care       Patient Instructions:  Allergies as of 08/27/2024       Reactions   Ketorolac  Other (See Comments)   Insensitivity per patient        Medication List     TAKE these medications    ALPRAZolam  0.5 MG tablet Commonly known as: XANAX  Take 0.5 mg by mouth 3 (three) times daily.   estradiol  1 MG tablet Commonly known as: ESTRACE  Take 1 tablet by mouth daily.   estradiol  0.05 MG/24HR patch Commonly known as:  VIVELLE -DOT Place 1 patch onto the skin 2 (two) times a week.   lisinopril  10 MG tablet Commonly known as: ZESTRIL  Take 10 mg by mouth daily.   metoCLOPramide  10 MG tablet Commonly known as: REGLAN  Take 1 tablet (10 mg total) by mouth every 6 (six) hours for 10 doses.   oxyCODONE  5 MG immediate release tablet Commonly known as: Oxy IR/ROXICODONE  Take 1 tablet (5 mg total) by mouth every 6 (six) hours as needed for up to 5 days for severe pain (pain score 7-10).   VITAMIN D PO Take 1 drop by mouth daily.       Activity: no driving while on analgesics and no heavy lifting for 4 weeks Diet: regular diet Wound Care: keep wound clean and dry  Follow-up scheduled on 09/23/24.  Signed: Leonor LITTIE Dawn 08/31/2024 9:54 AM
